# Patient Record
Sex: Male | Born: 1935 | Hispanic: Yes | Marital: Married | Smoking: Never smoker
Health system: Southern US, Community
[De-identification: ages and names within clinical notes are randomized; demographics above are authoritative.]

## PROBLEM LIST (undated history)

## (undated) DIAGNOSIS — E785 Hyperlipidemia, unspecified: Secondary | ICD-10-CM

## (undated) DIAGNOSIS — E039 Hypothyroidism, unspecified: Secondary | ICD-10-CM

## (undated) DIAGNOSIS — I10 Essential (primary) hypertension: Secondary | ICD-10-CM

## (undated) DIAGNOSIS — N400 Enlarged prostate without lower urinary tract symptoms: Secondary | ICD-10-CM

## (undated) HISTORY — PX: HERNIA REPAIR: SHX51

## (undated) HISTORY — PX: CHOLECYSTECTOMY: SHX55

---

## 2021-08-09 ENCOUNTER — Emergency Department: Payer: Medicare (Managed Care)

## 2021-08-09 ENCOUNTER — Emergency Department
Admission: EM | Admit: 2021-08-09 | Discharge: 2021-08-09 | Disposition: A | Discharge status: Home / Self Care | Payer: Medicare (Managed Care) | Attending: Emergency Medicine | Admitting: Emergency Medicine

## 2021-08-09 ENCOUNTER — Other Ambulatory Visit: Payer: Self-pay

## 2021-08-09 ENCOUNTER — Encounter: Payer: Self-pay | Admitting: Emergency Medicine

## 2021-08-09 DIAGNOSIS — I10 Essential (primary) hypertension: Secondary | ICD-10-CM | POA: Insufficient documentation

## 2021-08-09 DIAGNOSIS — R1030 Lower abdominal pain, unspecified: Secondary | ICD-10-CM | POA: Diagnosis present

## 2021-08-09 DIAGNOSIS — R339 Retention of urine, unspecified: Secondary | ICD-10-CM | POA: Diagnosis not present

## 2021-08-09 DIAGNOSIS — K5900 Constipation, unspecified: Secondary | ICD-10-CM

## 2021-08-09 HISTORY — DX: Essential (primary) hypertension: I10

## 2021-08-09 LAB — URINALYSIS, ROUTINE W REFLEX MICROSCOPIC
Bilirubin Urine: NEGATIVE
Glucose, UA: NEGATIVE mg/dL
Ketones, ur: NEGATIVE mg/dL
Nitrite: NEGATIVE
Protein, ur: 30 mg/dL — AB
RBC / HPF: >50 RBC/hpf — ABNORMAL HIGH (ref 0–5)
Specific Gravity, Urine: 1.01 (ref 1.005–1.030)
Squamous Epithelial / HPF: NONE SEEN (ref 0–5)
pH: 7 (ref 5.0–8.0)

## 2021-08-09 LAB — CBC WITH DIFFERENTIAL/PLATELET
Abs Immature Granulocytes: 0.03 10*3/uL (ref 0.00–0.07)
Basophils Absolute: 0.1 10*3/uL (ref 0.0–0.1)
Basophils Relative: 1 %
Eosinophils Absolute: 0.1 10*3/uL (ref 0.0–0.5)
Eosinophils Relative: 2 %
HCT: 42.8 % (ref 39.0–52.0)
Hemoglobin: 14.2 g/dL (ref 13.0–17.0)
Immature Granulocytes: 0 %
Lymphocytes Relative: 11 %
Lymphs Abs: 0.8 10*3/uL (ref 0.7–4.0)
MCH: 30.1 pg (ref 26.0–34.0)
MCHC: 33.2 g/dL (ref 30.0–36.0)
MCV: 90.7 fL (ref 80.0–100.0)
Monocytes Absolute: 0.8 10*3/uL (ref 0.1–1.0)
Monocytes Relative: 11 %
Neutro Abs: 5.3 10*3/uL (ref 1.7–7.7)
Neutrophils Relative %: 75 %
Platelets: 152 10*3/uL (ref 150–400)
RBC: 4.72 MIL/uL (ref 4.22–5.81)
RDW: 13 % (ref 11.5–15.5)
WBC: 7.1 10*3/uL (ref 4.0–10.5)
nRBC: 0 % (ref 0.0–0.2)

## 2021-08-09 LAB — BASIC METABOLIC PANEL
Anion gap: 6 (ref 5–15)
BUN: 14 mg/dL (ref 8–23)
CO2: 28 mmol/L (ref 22–32)
Calcium: 8.7 mg/dL — ABNORMAL LOW (ref 8.9–10.3)
Chloride: 107 mmol/L (ref 98–111)
Creatinine, Ser: 0.87 mg/dL (ref 0.61–1.24)
GFR, Estimated: >60 mL/min (ref >60)
Glucose, Bld: 129 mg/dL — ABNORMAL HIGH (ref 70–99)
Potassium: 3.5 mmol/L (ref 3.5–5.1)
Sodium: 141 mmol/L (ref 135–145)

## 2021-08-09 MED ORDER — LACTULOSE 10 GM/15ML PO SOLN: 20.0000 g | Freq: Every day | ORAL | 0 refills | Status: AC | PRN

## 2021-08-09 MED ORDER — CEPHALEXIN 500 MG PO CAPS: 500.0000 mg | ORAL_CAPSULE | Freq: Two times a day (BID) | ORAL | 0 refills | Status: DC

## 2021-08-09 NOTE — ED Notes (Signed)
Patient transported to CT 

## 2021-08-09 NOTE — ED Notes (Signed)
Pt switched to leg bag for discharge, pt and family given instruction on catheter care and how to switch to lag bag and standard drainage bag. Pt and family state understanding. Pt given standard drainage bad and extra leg bag. Pt given dc instructions to follow up with urology.

## 2021-08-09 NOTE — ED Provider Notes (Signed)
Patient received in signout from Dr. Beather Arbour pending follow-up labs and urinalysis.  Urinalysis does have hematuria with rare bacteria given acute retention and imaging showing probable cystitis will cover with antibiotics.  Blood work otherwise reassuring.  I do believe patient is appropriate for discharge and outpatient follow-up.   Merlyn Lot, MD 08/09/21 [phone]

## 2021-08-09 NOTE — ED Notes (Signed)
X-ray at bedside

## 2021-08-09 NOTE — Discharge Instructions (Addendum)
Keep bladder catheter in place until seen by the urologist.  I have written an antibiotic to to treat a possible bladder infection. You may take Lactulose as needed for bowel movements.  Return to the ER for worsening symptoms, persistent vomiting, difficulty breathing or other concerns.

## 2021-08-09 NOTE — ED Notes (Signed)
Bladder scan:730

## 2021-08-09 NOTE — ED Provider Notes (Signed)
Alec Estes    Event Date/Time   First MD Initiated Contact with Patient 08/09/21 [phone]     (approximate)   History   Urinary Retention   HPI  History obtained via patient and his daughter who interprets for him  Alec Estes is a 88 y.o. male brought to the ED from home by his daughter with a chief complaint of urinating difficulties since last night.  Voiding small amounts at a time.  Complains of suprapubic abdominal pain.  Last bowel movement 3 days ago; feels constipated.  Denies fever, chills, cough, chest pain, shortness of breath, nausea, vomiting or diarrhea.     Past Medical History   Past Medical History:  Diagnosis Date   Hypertension      Active Problem List  There are no problems to display for this patient.    Past Surgical History   Past Surgical History:  Procedure Laterality Date   CHOLECYSTECTOMY     HERNIA REPAIR       Home Medications   Prior to Admission medications   Medication Sig Start Date End Date Taking? Authorizing Provider  lactulose (CHRONULAC) 10 GM/15ML solution Take 30 mLs (20 g total) by mouth daily as needed for mild constipation. 08/09/21  Yes Paulette Blanch, MD     Allergies  Patient has no known allergies.   Family History  No family history on file.   Physical Exam  Triage Vital Signs: ED Triage Vitals  Enc Vitals Group     BP 08/09/21 0445 (!) 148/83     Pulse Rate 08/09/21 0445 (!) 113     Resp 08/09/21 0445 18     Temp 08/09/21 0445 98.4 F (36.9 C)     Temp src --      SpO2 08/09/21 0445 92 %     Weight 08/09/21 0446 136 lb 11 oz (62 kg)     Height --      Head Circumference --      Peak Flow --      Pain Score 08/09/21 0446 10     Pain Loc --      Pain Edu? --      Excl. in [place]? --     Updated Vital Signs: BP 135/73    Pulse 86    Temp 98.4 F (36.9 C)    Resp 16    Wt 62 kg    SpO2 92%    General: Awake, mild distress.  CV:  Tachycardic.   Good peripheral perfusion.  Resp:  Normal effort.  Abd:  Mild suprapubic abdominal pain with firm bladder.  No distention.  Other:  No vesicles.   ED Results / Procedures / Treatments  Labs (all labs ordered are listed, but only abnormal results are displayed) Labs Reviewed  URINE CULTURE  CBC WITH DIFFERENTIAL/PLATELET  BASIC METABOLIC PANEL  URINALYSIS, ROUTINE W REFLEX MICROSCOPIC     EKG  None   RADIOLOGY I have independently visualized and interpreted patient's three-way abdomen as well as reviewed the radiology interpretation:  3 way abdomen: Bilateral nephrolithiasis, no free air, no acute cardiopulmonary disease; moderate stool burden  CT renal stone study: Bilateral nephrolithiasis, no obstructive uropathy, gallstone, prostamegaly, moderate retained stool  Official radiology report(s): DG Abd Acute W/Chest  Result Date: 08/09/2021 CLINICAL DATA:  88 year old male with difficulty voiding. EXAM: DG ABDOMEN ACUTE WITH 1 VIEW CHEST COMPARISON:  None. FINDINGS: Portable AP upright view of the chest at 0527  hours. Lung volumes and mediastinal contours within normal limits. Visualized tracheal air column is within normal limits. Allowing for portable technique the lungs are clear. No pneumothorax or pneumoperitoneum. Upright and supine views of the abdomen and pelvis. No pneumoperitoneum. Calcified aortic atherosclerosis. Evidence of bilateral nephrolithiasis, with bulky 17 mm calcification projecting over the left renal shadow. Smaller oval up to 7 mm calcifications on the right side. Phleboliths and other vascular calcifications in the pelvis. Non obstructed bowel gas pattern. Moderate levoconvex lumbar scoliosis with advanced lumbar disc and endplate degeneration. No acute osseous abnormality identified. IMPRESSION: 1. Bilateral nephrolithiasis, including bulky 17 mm left side calculus. Small pelvic calcifications favored to be phleboliths rather than distal ureteral stones. 2.  Normal bowel gas pattern, no free air. 3. No acute cardiopulmonary abnormality. 4.  Aortic Atherosclerosis (ICD10-I70.0). Electronically Signed   By: Genevie Ann M.D.   On: 08/09/2021 05:35   CT Renal Stone Study  Result Date: 08/09/2021 CLINICAL DATA:  Evaluate kidney stones.  Difficulty urinating. EXAM: CT ABDOMEN AND PELVIS WITHOUT CONTRAST TECHNIQUE: Multidetector CT imaging of the abdomen and pelvis was performed following the standard protocol without IV contrast. RADIATION DOSE REDUCTION: This exam was performed according to the departmental dose-optimization program which includes automated exposure control, adjustment of the mA and/or kV according to patient size and/or use of iterative reconstruction technique. COMPARISON:  None FINDINGS: Lower chest: Scar identified within the left base. Hepatobiliary: No focal liver abnormality. Calcified gallstone measures 6 mm. No signs of gallbladder wall thickening or biliary dilatation. Pancreas: Unremarkable. No pancreatic ductal dilatation or surrounding inflammatory changes. Spleen: Normal in size without focal abnormality. Adrenals/Urinary Tract: Normal adrenal glands. Bilateral nephrolithiasis. Within the upper pole of the left kidney there is a large stone measuring up to 1.5 cm. There is also a small inferior pole stone measuring 1-2 mm. Multiple right renal calculi are noted. The largest stone is in the interpolar collecting system measuring 4 mm. No hydronephrosis identified bilaterally. No hydroureter or ureteral lithiasis. The bladder is partially decompressed around a Foley catheter. There is mild soft tissue stranding surrounding the decompressed bladder. Stomach/Bowel: Stomach appears normal. The appendix is visualized and is unremarkable. Moderate retained stool noted throughout the colon. Vascular/Lymphatic: No bowel wall thickening, inflammation, or distension. Reproductive: Prostate gland measures 5.1 x 5.9 by 6.6 cm (volume = 100 cm^3), image  79/2. Calcifications are scattered throughout the prostate gland. Other: No free fluid or fluid collections identified. Fat containing umbilical hernia. Signs of previous left inguinal hernia repair identified. Musculoskeletal: Multilevel degenerative disc disease is identified throughout the lumbar spine. IMPRESSION: 1. Bilateral nephrolithiasis. There is no hydronephrosis, hydroureter or ureteral lithiasis at this time. 2. Urinary bladder is decompressed around a Foley catheter balloon. Soft tissue stranding is identified surrounding the decompressed bladder which may reflect sequelae of cystitis or chronic bladder outlet obstruction. 3. Prostatomegaly. 4. Gallstone. 5. Moderate retained stool noted throughout the colon. Correlate for any clinical symptoms of constipation. Electronically Signed   By: Kerby Moors M.D.   On: 08/09/2021 06:37     PROCEDURES:  Critical Care performed: No  Procedures   MEDICATIONS ORDERED IN ED: Medications - No data to display   IMPRESSION / MDM / [place] / ED COURSE  I reviewed the triage vital signs and the nursing notes.                             88 year old male presenting with  urinary difficulty. Differential diagnosis includes, but is not limited to, acute appendicitis, renal colic, testicular torsion, urinary tract infection/pyelonephritis, prostatitis,  epididymitis, diverticulitis, small bowel obstruction or ileus, colitis, abdominal aortic aneurysm, gastroenteritis, hernia, etc. I have reviewed patient's old records and do not see any in the electronic healthcare system.  The patient is on the cardiac monitor to evaluate for evidence of arrhythmia and/or significant heart rate changes.  We will obtain basic lab work, UA, bladder scan, three-way abdomen.  Will reassess.  Clinical Course as of 08/09/21 0652  Thu Aug 09, 2021  [id number] Bladder scan revealed 730 mL; Foley inserted with immediate relief of symptoms.  Three-way abdomen  demonstrates bilateral nephrolithiasis and moderate stool burden; will obtain CT renal stone study to further characterize nephrolithiasis. [JS]  [phone] CT scan reveals no obstructive uropathy, gallstone, moderate stool burden. [JS]  [id number] CBC demonstrates normal WBC.  Awaiting results of BMP and UA.  Care will be transferred to the oncoming provider at change of shift.  Anticipate patient may be discharged home with Foley catheter, lactulose prescription and urology follow-up. [JS]    Clinical Course User Index [JS] Paulette Blanch, MD     FINAL CLINICAL IMPRESSION(S) / ED DIAGNOSES   Final diagnoses:  Urinary retention  Constipation, unspecified constipation type     Rx / DC Orders   ED Discharge Orders          Ordered    lactulose ([place]) 10 GM/15ML solution  Daily PRN        08/09/21 [id number]             Estes:  This document was prepared using Dragon voice recognition software and may include unintentional dictation errors.   Paulette Blanch, MD 08/09/21 [phone]

## 2021-08-09 NOTE — ED Triage Notes (Signed)
Pt to triage via w/c accomp by daughter, per stratus video interpreter, pt reports difficulty urinating since last night (last void approx day ago); denies hx of same

## 2021-08-10 LAB — URINE CULTURE: Culture: NO GROWTH

## 2021-08-11 ENCOUNTER — Emergency Department
Admission: EM | Admit: 2021-08-11 | Discharge: 2021-08-11 | Disposition: A | Discharge status: Home / Self Care | Payer: Medicare (Managed Care) | Attending: Emergency Medicine | Admitting: Emergency Medicine

## 2021-08-11 ENCOUNTER — Encounter: Payer: Self-pay | Admitting: Emergency Medicine

## 2021-08-11 ENCOUNTER — Other Ambulatory Visit: Payer: Self-pay

## 2021-08-11 DIAGNOSIS — Z7689 Persons encountering health services in other specified circumstances: Secondary | ICD-10-CM

## 2021-08-11 DIAGNOSIS — Z436 Encounter for attention to other artificial openings of urinary tract: Secondary | ICD-10-CM | POA: Insufficient documentation

## 2021-08-11 NOTE — ED Notes (Signed)
Pt discharge information reviewed. Pt understands need for follow up care and when to return if symptoms worsen. All questions answered. Pt is alert and oriented with even and regular respirations. Pt is brought out of department in wheelchiar by family.

## 2021-08-11 NOTE — ED Notes (Signed)
Pt to ED with daughter for foley cath removal. See triage note. Pt has urologist appt on 3/2 and had foley placed for urinary retention less than 1 week ago. Daughter was under impression that was supposed to have foley removed but then showed this RN picture of d/c papers from last visit which specify to leave foley in place until sees urologist.  Encouraged to use lactulose for constipation and to eat more vegetables.  Pt in NAD. Daughter would like to know if foley needs to be changed out before urologist appt.

## 2021-08-11 NOTE — ED Provider Notes (Signed)
[facility] Provider Note  Patient Contact: 6:22 PM (approximate)   History   No chief complaint on file.   HPI  Alec Estes is a 88 y.o. male who presents to the emergency department for assessment of his Foley catheter.  Patient had urinary retention, had Foley cath placed.  The patient and family were under the assumption that after 2 to 3 days he should return to have it removed.  Patient is to leave his Foley catheter in place until he sees urology on 2 March.  He has no abdominal pain, fevers, chills, dysuria.  He still has urine output through his Foley cath.     Physical Exam   Triage Vital Signs: ED Triage Vitals  Enc Vitals Group     BP 08/11/21 1632 118/60     Pulse Rate 08/11/21 1632 80     Resp 08/11/21 1632 18     Temp 08/11/21 1632 98.1 F (36.7 C)     Temp Source 08/11/21 1632 Oral     SpO2 08/11/21 1632 95 %     Weight --      Height --      Head Circumference --      Peak Flow --      Pain Score 08/11/21 1633 0     Pain Loc --      Pain Edu? --      Excl. in [place]? --     Most recent vital signs: Vitals:   08/11/21 1632  BP: 118/60  Pulse: 80  Resp: 18  Temp: 98.1 F (36.7 C)  SpO2: 95%     General: Alert and in no acute distress.  Cardiovascular:  Good peripheral perfusion Respiratory: Normal respiratory effort without tachypnea or retractions. Lungs CTAB.  Gastrointestinal: Bowel sounds 4 quadrants. Soft and nontender to palpation. No guarding or rigidity. No palpable masses. No distention. No CVA tenderness.  Urine is noted in the Foley catheter bag Musculoskeletal: Full range of motion to all extremities.  Neurologic:  No gross focal neurologic deficits are appreciated.  Skin:   No rash noted Other:   ED Results / Procedures / Treatments   Labs (all labs ordered are listed, but only abnormal results are displayed) Labs Reviewed - No data to display   EKG     RADIOLOGY    No results  found.  PROCEDURES:  Critical Care performed: No  Procedures   MEDICATIONS ORDERED IN ED: Medications - No data to display   IMPRESSION / MDM / ASSESSMENT AND PLAN / ED COURSE  I reviewed the triage vital signs and the nursing notes.                              Differential diagnosis includes, but is not limited to, accidental removal of Foley cath, Foley cath problems, urinary retention   Patient's diagnosis is consistent with encounter for assessment of Foley cath.  Patient presented with his daughter for possible removal of Foley cath.  They were under the assumption that he would only need to keep his Foley cath for 2 to 3 days.  Patient has good urinary output.  No complaints.  Patient is supposed to leave his Foley cath in until he sees urology on 2 March.  After patient and his daughter were informed of this they have no additional complaints.  There is no indication for labs or imaging.  The follow-up with  urology on their scheduled appointment on 08/23/2021.Alec Estes  Patient is given ED precautions to return to the ED for any worsening or new symptoms.        FINAL CLINICAL IMPRESSION(S) / ED DIAGNOSES   Final diagnoses:  Encounter for assessment of Foley catheter     Rx / DC Orders   ED Discharge Orders     None        Note:  This document was prepared using Dragon voice recognition software and may include unintentional dictation errors.   Alec Estes 08/11/21 1857    Alec Hacking, MD 08/12/21 1530

## 2021-08-11 NOTE — ED Triage Notes (Signed)
Pt via POV from home. Pt states they are here to get the cath removed. Pt was seen here on 2/16 and Was instructed to get the foley removed 3-4 days are d/c per family. Family states they have a urology appt on 3/2. Per family, pt has not had a BM since he was here. Last BM 2/16. Pt was prescribed Lactulose and family has not tried it because they thought It was for him not to have a BM.   Spanish interpreter needed.

## 2021-08-16 ENCOUNTER — Inpatient Hospital Stay
Admission: EM | Admit: 2021-08-16 | Discharge: 2021-08-19 | DRG: 698 | Disposition: A | Discharge status: Home / Self Care | Payer: Medicare (Managed Care) | Attending: Internal Medicine | Admitting: Internal Medicine

## 2021-08-16 ENCOUNTER — Emergency Department: Payer: Medicare (Managed Care)

## 2021-08-16 ENCOUNTER — Inpatient Hospital Stay: Payer: Medicare (Managed Care)

## 2021-08-16 ENCOUNTER — Encounter: Payer: Self-pay | Admitting: *Deleted

## 2021-08-16 DIAGNOSIS — T83511A Infection and inflammatory reaction due to indwelling urethral catheter, initial encounter: Secondary | ICD-10-CM | POA: Diagnosis present

## 2021-08-16 DIAGNOSIS — N3000 Acute cystitis without hematuria: Secondary | ICD-10-CM | POA: Diagnosis present

## 2021-08-16 DIAGNOSIS — Z8249 Family history of ischemic heart disease and other diseases of the circulatory system: Secondary | ICD-10-CM

## 2021-08-16 DIAGNOSIS — R519 Headache, unspecified: Secondary | ICD-10-CM | POA: Diagnosis present

## 2021-08-16 DIAGNOSIS — R338 Other retention of urine: Secondary | ICD-10-CM | POA: Diagnosis present

## 2021-08-16 DIAGNOSIS — N400 Enlarged prostate without lower urinary tract symptoms: Secondary | ICD-10-CM | POA: Diagnosis present

## 2021-08-16 DIAGNOSIS — H9190 Unspecified hearing loss, unspecified ear: Secondary | ICD-10-CM | POA: Diagnosis present

## 2021-08-16 DIAGNOSIS — E039 Hypothyroidism, unspecified: Secondary | ICD-10-CM | POA: Diagnosis present

## 2021-08-16 DIAGNOSIS — N390 Urinary tract infection, site not specified: Secondary | ICD-10-CM | POA: Diagnosis present

## 2021-08-16 DIAGNOSIS — N10 Acute tubulo-interstitial nephritis: Secondary | ICD-10-CM | POA: Diagnosis present

## 2021-08-16 DIAGNOSIS — E785 Hyperlipidemia, unspecified: Secondary | ICD-10-CM | POA: Diagnosis present

## 2021-08-16 DIAGNOSIS — N401 Enlarged prostate with lower urinary tract symptoms: Secondary | ICD-10-CM | POA: Diagnosis present

## 2021-08-16 DIAGNOSIS — Z20822 Contact with and (suspected) exposure to covid-19: Secondary | ICD-10-CM | POA: Diagnosis present

## 2021-08-16 DIAGNOSIS — N200 Calculus of kidney: Secondary | ICD-10-CM | POA: Diagnosis present

## 2021-08-16 DIAGNOSIS — A419 Sepsis, unspecified organism: Secondary | ICD-10-CM

## 2021-08-16 DIAGNOSIS — Y846 Urinary catheterization as the cause of abnormal reaction of the patient, or of later complication, without mention of misadventure at the time of the procedure: Secondary | ICD-10-CM | POA: Diagnosis present

## 2021-08-16 DIAGNOSIS — I10 Essential (primary) hypertension: Secondary | ICD-10-CM | POA: Diagnosis present

## 2021-08-16 DIAGNOSIS — A4152 Sepsis due to Pseudomonas: Secondary | ICD-10-CM | POA: Diagnosis present

## 2021-08-16 HISTORY — DX: Benign prostatic hyperplasia without lower urinary tract symptoms: N40.0

## 2021-08-16 HISTORY — DX: Hyperlipidemia, unspecified: E78.5

## 2021-08-16 HISTORY — DX: Hypothyroidism, unspecified: E03.9

## 2021-08-16 LAB — CBC WITH DIFFERENTIAL/PLATELET
Abs Immature Granulocytes: 0.23 10*3/uL — ABNORMAL HIGH (ref 0.00–0.07)
Basophils Absolute: 0.1 10*3/uL (ref 0.0–0.1)
Basophils Relative: 0 %
Eosinophils Absolute: 0 10*3/uL (ref 0.0–0.5)
Eosinophils Relative: 0 %
HCT: 41.8 % (ref 39.0–52.0)
Hemoglobin: 13.9 g/dL (ref 13.0–17.0)
Immature Granulocytes: 1 %
Lymphocytes Relative: 5 %
Lymphs Abs: 1.2 10*3/uL (ref 0.7–4.0)
MCH: 29.6 pg (ref 26.0–34.0)
MCHC: 33.3 g/dL (ref 30.0–36.0)
MCV: 89.1 fL (ref 80.0–100.0)
Monocytes Absolute: 2 10*3/uL — ABNORMAL HIGH (ref 0.1–1.0)
Monocytes Relative: 9 %
Neutro Abs: 19.1 10*3/uL — ABNORMAL HIGH (ref 1.7–7.7)
Neutrophils Relative %: 85 %
Platelets: 191 10*3/uL (ref 150–400)
RBC: 4.69 MIL/uL (ref 4.22–5.81)
RDW: 13.3 % (ref 11.5–15.5)
WBC: 22.6 10*3/uL — ABNORMAL HIGH (ref 4.0–10.5)
nRBC: 0 % (ref 0.0–0.2)

## 2021-08-16 LAB — COMPREHENSIVE METABOLIC PANEL
ALT: 20 U/L (ref 0–44)
AST: 21 U/L (ref 15–41)
Albumin: 3.6 g/dL (ref 3.5–5.0)
Alkaline Phosphatase: 90 U/L (ref 38–126)
Anion gap: 8 (ref 5–15)
BUN: 20 mg/dL (ref 8–23)
CO2: 24 mmol/L (ref 22–32)
Calcium: 8.4 mg/dL — ABNORMAL LOW (ref 8.9–10.3)
Chloride: 101 mmol/L (ref 98–111)
Creatinine, Ser: 0.98 mg/dL (ref 0.61–1.24)
GFR, Estimated: >60 mL/min (ref >60)
Glucose, Bld: 132 mg/dL — ABNORMAL HIGH (ref 70–99)
Potassium: 3.8 mmol/L (ref 3.5–5.1)
Sodium: 133 mmol/L — ABNORMAL LOW (ref 135–145)
Total Bilirubin: 1.6 mg/dL — ABNORMAL HIGH (ref 0.3–1.2)
Total Protein: 7.4 g/dL (ref 6.5–8.1)

## 2021-08-16 LAB — URINALYSIS, ROUTINE W REFLEX MICROSCOPIC
Bilirubin Urine: NEGATIVE
Glucose, UA: NEGATIVE mg/dL
Ketones, ur: 5 mg/dL — AB
Nitrite: NEGATIVE
Protein, ur: 100 mg/dL — AB
Specific Gravity, Urine: 1.017 (ref 1.005–1.030)
WBC, UA: >50 WBC/hpf — ABNORMAL HIGH (ref 0–5)
pH: 7 (ref 5.0–8.0)

## 2021-08-16 LAB — LACTIC ACID, PLASMA: Lactic Acid, Venous: 1.4 mmol/L (ref 0.5–1.9)

## 2021-08-16 LAB — PROTIME-INR
INR: 1.2 (ref 0.8–1.2)
Prothrombin Time: 14.7 seconds (ref 11.4–15.2)

## 2021-08-16 MED ORDER — PIPERACILLIN-TAZOBACTAM 3.375 G IVPB
3.3750 g | Freq: Three times a day (TID) | INTRAVENOUS | Status: DC
  Administered 2021-08-17 – 2021-08-18 (×4): 3.375 g via INTRAVENOUS
  Filled 2021-08-16 (×4): qty 50

## 2021-08-16 MED ORDER — ACETAMINOPHEN 650 MG RE SUPP: 650.0000 mg | Freq: Four times a day (QID) | RECTAL | Status: DC | PRN

## 2021-08-16 MED ORDER — LACTATED RINGERS IV SOLN: INTRAVENOUS | Status: AC

## 2021-08-16 MED ORDER — PIPERACILLIN-TAZOBACTAM 3.375 G IVPB 30 MIN
3.3750 g | Freq: Once | INTRAVENOUS | Status: AC
  Administered 2021-08-16: 3.375 g via INTRAVENOUS
  Filled 2021-08-16: qty 50

## 2021-08-16 MED ORDER — ACETAMINOPHEN 325 MG PO TABS
650.0000 mg | ORAL_TABLET | Freq: Four times a day (QID) | ORAL | Status: DC | PRN
  Administered 2021-08-17: 650 mg via ORAL
  Filled 2021-08-16: qty 2

## 2021-08-16 NOTE — Progress Notes (Signed)
Pharmacy Antibiotic Note  Malachai Ingersoll is a 88 y.o. male admitted on 08/16/2021 with sepsis.  Pharmacy has been consulted for Zosyn dosing.  Plan: Zosyn 3.375g IV q8h (4 hour infusion).  Pharmacy will continue to follow and adjust abx dosing whenever warranted.  Height: 5\' 5"  (165.1 cm) Weight: 62 kg (136 lb 11 oz) IBW/kg (Calculated) : 61.5  Temp (24hrs), Avg:103 F (39.4 C), Min:103 F (39.4 C), Max:103 F (39.4 C)  Recent Labs  Lab 08/16/21 2158 08/16/21 2202  WBC 22.6*  --   CREATININE 0.98  --   LATICACIDVEN  --  1.4    Estimated Creatinine Clearance: 44.5 mL/min (by C-G formula based on SCr of 0.98 mg/dL).    No Known Allergies  Antimicrobials this admission: 2/23 Zosyn >>   Microbiology results: 2/23 BCx: Pending 2/23 UCx: Pending   Thank you for allowing pharmacy to be a part of this patients care.  Renda Rolls, PharmD, [facility] 08/16/2021 11:50 PM

## 2021-08-16 NOTE — Progress Notes (Signed)
°  Carryover admission to the Day Admitter.  I discussed this case with the EDP, Dr.Quale.  Per these discussions:   This is a 88 year old male who is being admitted for sepsis due to suspected urinary tract infection.  He had presented to the ED approximately 1 week ago, at which time he was diagnosed with acute urinary retention.  Foley catheter was placed in the ED, the patient was discharged to home on Keflex for potential urinary tract infection, the course of which he subsequently completed.  Ensuing urine culture reportedly negative.  Today, the patient presents back to the emergency department complaining of new onset fevers.  Today's urinalysis, with Foley catheter in place, demonstrating greater than 50 white blood cells.  He is also mildly tachycardic, mildly tachypneic.  CT abdomen/pelvis showed evidence of bladder wall thickening consistent with acute cystitis, but otherwise reportedly showed no evidence of acute process.  Blood cultures x2 urine culture collected followed by initiation of Zosyn for sepsis due to suspected urinary tract infection after failure of outpatient Keflex.  Of note, COVID-19/influenza PCR currently pending.  Lactic acid 1.4.  I have placed order to admit patient to med telemetry for further evaluation and management of sepsis due to suspected urinary tract infection. I have placed some preliminary admit orders via the adult multi admission order set.  I continued Zosyn.  Of also ordered a chest x-ray as well as procalcitonin, and started the patient on gentle IV fluids.  I have also ordered morning labs.   Of note, the patient is reportedly Spanish-speaking predominant, and hard of hearing.   Newton Pigg, DO Hospitalist

## 2021-08-16 NOTE — ED Triage Notes (Signed)
Pt brought in via ems from home.  Pt with weakness for 4 days.  Pt has a uti and has a foley in place since last week.  Pt alert  interpreter on a stick used in triage.

## 2021-08-16 NOTE — Progress Notes (Signed)
Sepsis tracking by eLINK 

## 2021-08-16 NOTE — ED Provider Notes (Signed)
[facility] Provider Note    Event Date/Time   First MD Initiated Contact with Patient 08/16/21 2331     (approximate)   History   Weakness  Video interpreter services used, but somewhat difficult as the patient very hard of hearing.  However his family at the bedside also helps and assist with obtaining history, discussing with the patient and assisting in getting clinical history.  Video interpreter utilized  HPI  Alec Estes is a 88 y.o. male with a history of recent urinary retention, Foley catheter insertion.  About a week ago he had difficulty urinating he had to have a catheter inserted and developed a possible urinary tract infection.  He is completed antibiotic for that twice daily for the last several days.  Today started feeling very fatigued weak to the point he could not stand up, lightheaded with standing and "weak in the knees"  He had body aches and fever and mild headache  No neck pain no difficulty breathing no chest pain.  Foley catheters remain in place   Past Medical History:  Diagnosis Date   Hypertension         Physical Exam   Triage Vital Signs: ED Triage Vitals  Enc Vitals Group     BP 08/16/21 2143 130/63     Pulse Rate 08/16/21 2143 (!) 102     Resp 08/16/21 2143 (!) 24     Temp 08/16/21 2143 (!) 103 F (39.4 C)     Temp Source 08/16/21 2143 Oral     SpO2 08/16/21 2143 90 %     Weight 08/16/21 2144 136 lb 11 oz (62 kg)     Height 08/16/21 2308 5\' 5"  (1.651 m)     Head Circumference --      Peak Flow --      Pain Score 08/16/21 2142 0     Pain Loc --      Pain Edu? --      Excl. in GC? --     Most recent vital signs: Vitals:   08/16/21 2143 08/16/21 2225  BP: 130/63 138/72  Pulse: 100 95  Resp: 16 17  Temp: (!) 103 F (39.4 C)   SpO2: 90% 96%     General: Awake, no distress.  Appears mildly ill, fatigued but not in acute distress CV:  Good peripheral perfusion.  Tachycardia regular  normal heart sounds Resp:  Normal effort.  Clear lung fields bilaterally.  Speaks in clear sentences Abd:  No distention.  Denies pain to palpation in any quadrant or area.  The penis appears normal without surrounding erythema or obvious drainage or erosion Other:  Moves all extremities well reports pain and achiness primarily in his knee joints, but knee joints appear normal no erythema warmth or effusion  He reports mild headache, but no photophobia.  No meningismus.  Full range of motion of neck without pain or stiffness  ED Results / Procedures / Treatments   Labs (all labs ordered are listed, but only abnormal results are displayed) Labs Reviewed  COMPREHENSIVE METABOLIC PANEL - Abnormal; Notable for the following components:      Result Value   Sodium 133 (*)    Glucose, Bld 132 (*)    Calcium 8.4 (*)    Total Bilirubin 1.6 (*)    All other components within normal limits  CBC WITH DIFFERENTIAL/PLATELET - Abnormal; Notable for the following components:   WBC 22.6 (*)    Neutro Abs 19.1 (*)  Monocytes Absolute 2.0 (*)    Abs Immature Granulocytes 0.23 (*)    All other components within normal limits  URINALYSIS, ROUTINE W REFLEX MICROSCOPIC - Abnormal; Notable for the following components:   Color, Urine AMBER (*)    APPearance CLOUDY (*)    Hgb urine dipstick MODERATE (*)    Ketones, ur 5 (*)    Protein, ur 100 (*)    Leukocytes,Ua LARGE (*)    WBC, UA >50 (*)    Bacteria, UA RARE (*)    All other components within normal limits  CULTURE, BLOOD (ROUTINE X 2)  URINE CULTURE  RESP PANEL BY RT-PCR (FLU A&B, COVID) ARPGX2  CULTURE, BLOOD (ROUTINE X 2) W REFLEX TO ID PANEL  LACTIC ACID, PLASMA  PROTIME-INR  LACTIC ACID, PLASMA  MAGNESIUM  COMPREHENSIVE METABOLIC PANEL  CBC WITH DIFFERENTIAL/PLATELET  PROCALCITONIN   Labs reviewed remarkable for significant leukocytes white cells and bacteria in his urine.  Additionally severe leukocytosis white count of 22,000 and  mild elevation of creatinine.   EKG  Reviewed and interpreted by me at 2200 Heart rate 100 QRS 89 QTc 470 Sinus tachycardia, mild nonspecific T wave abnormality.  Mild prolongation of QT interval   RADIOLOGY  Chest x-ray reviewed, mild left greater than right bibasilar airspace disease possibly atelectasis.  Mild cardiomegaly  CT abdomen pelvis personally viewed by me and interpreted as grossly without acute hydronephrosis.  Defer to radiologist for additional detailed read as noted below IMPRESSION: 1. Multiple bilateral kidney stones without hydronephrosis or ureteral stone. Decompressed urinary bladder with Foley catheter present. Bladder is diffusely thick-walled with perivesical soft tissue stranding which may be due to cystitis or chronic obstruction 2. Markedly enlarged prostate gland 3. Small bilateral effusions 4. Diverticular disease of the distal colon without acute wall thickening Electronically Signed   By: Jasmine Pang M.D.   On: 08/16/2021 23:10       PROCEDURES:  Critical Care performed: Yes, see critical care procedure note(s) CRITICAL CARE Performed by: Sharyn Creamer   Total critical care time: 30 minutes  Critical care time was exclusive of separately billable procedures and treating other patients.  Critical care was necessary to treat or prevent imminent or life-threatening deterioration.  Critical care was time spent personally by me on the following activities: development of treatment plan with patient and/or surrogate as well as nursing, discussions with consultants, evaluation of patient's response to treatment, examination of patient, obtaining history from patient or surrogate, ordering and performing treatments and interventions, ordering and review of laboratory studies, ordering and review of radiographic studies, pulse oximetry and re-evaluation of patient's condition.  Procedures   MEDICATIONS ORDERED IN ED: Medications  acetaminophen (TYLENOL)  tablet 650 mg (has no administration in time range)    Or  acetaminophen (TYLENOL) suppository 650 mg (has no administration in time range)  lactated ringers infusion (has no administration in time range)  piperacillin-tazobactam (ZOSYN) IVPB 3.375 g (has no administration in time range)  piperacillin-tazobactam (ZOSYN) IVPB 3.375 g (3.375 g Intravenous New Bag/Given 08/16/21 2313)     IMPRESSION / MDM / ASSESSMENT AND PLAN / ED COURSE  I reviewed the triage vital signs and the nursing notes.                              Differential diagnosis includes, but is not limited to, catheter associated UTI, sepsis, toxic or metabolic illogic etiology of fever though favored to be  infectious based on the clinical presentation.  He does not have evidence to support  The patient is on the cardiac monitor to evaluate for evidence of arrhythmia and/or significant heart rate changes.  Labs are reviewed concerning for possible urinary tract infection catheter related upon review of the urinalysis, also notable leukocytosis concerning for infectious etiology  Reviewed prior urine culture which was no growth, he has been on Keflex and given his worsening symptoms and findings that I suspect are indicative of catheter associated urinary tract infection will start on broader coverage including Zosyn and antipseudomonal coverage, etc  Patient and family understand agreeable with plan for admission.  Admission discussed with Dr. Arlean Hopping   FINAL CLINICAL IMPRESSION(S) / ED DIAGNOSES   Final diagnoses:  Urinary tract infection associated with indwelling urethral catheter, initial encounter (HCC)  Sepsis, due to unspecified organism, unspecified whether acute organ dysfunction present [facility])  Catheter associated UTI Sepsis   Rx / DC Orders   ED Discharge Orders     None        Note:  This document was prepared using Dragon voice recognition software and may include unintentional dictation  errors.   Sharyn Creamer, MD 08/17/21 [phone]

## 2021-08-17 ENCOUNTER — Encounter: Payer: Self-pay | Admitting: Internal Medicine

## 2021-08-17 ENCOUNTER — Other Ambulatory Visit: Payer: Self-pay

## 2021-08-17 DIAGNOSIS — A419 Sepsis, unspecified organism: Secondary | ICD-10-CM

## 2021-08-17 DIAGNOSIS — N400 Enlarged prostate without lower urinary tract symptoms: Secondary | ICD-10-CM | POA: Diagnosis present

## 2021-08-17 DIAGNOSIS — E039 Hypothyroidism, unspecified: Secondary | ICD-10-CM | POA: Diagnosis present

## 2021-08-17 DIAGNOSIS — I10 Essential (primary) hypertension: Secondary | ICD-10-CM

## 2021-08-17 DIAGNOSIS — N3000 Acute cystitis without hematuria: Secondary | ICD-10-CM

## 2021-08-17 DIAGNOSIS — E785 Hyperlipidemia, unspecified: Secondary | ICD-10-CM | POA: Diagnosis present

## 2021-08-17 LAB — CBC WITH DIFFERENTIAL/PLATELET
Abs Immature Granulocytes: 0.44 10*3/uL — ABNORMAL HIGH (ref 0.00–0.07)
Basophils Absolute: 0.1 10*3/uL (ref 0.0–0.1)
Basophils Relative: 0 %
Eosinophils Absolute: 0 10*3/uL (ref 0.0–0.5)
Eosinophils Relative: 0 %
HCT: 38.8 % — ABNORMAL LOW (ref 39.0–52.0)
Hemoglobin: 13.1 g/dL (ref 13.0–17.0)
Immature Granulocytes: 2 %
Lymphocytes Relative: 6 %
Lymphs Abs: 1.4 10*3/uL (ref 0.7–4.0)
MCH: 30 pg (ref 26.0–34.0)
MCHC: 33.8 g/dL (ref 30.0–36.0)
MCV: 89 fL (ref 80.0–100.0)
Monocytes Absolute: 2.1 10*3/uL — ABNORMAL HIGH (ref 0.1–1.0)
Monocytes Relative: 9 %
Neutro Abs: 19.4 10*3/uL — ABNORMAL HIGH (ref 1.7–7.7)
Neutrophils Relative %: 83 %
Platelets: 168 10*3/uL (ref 150–400)
RBC: 4.36 MIL/uL (ref 4.22–5.81)
RDW: 13.3 % (ref 11.5–15.5)
WBC: 23.5 10*3/uL — ABNORMAL HIGH (ref 4.0–10.5)
nRBC: 0 % (ref 0.0–0.2)

## 2021-08-17 LAB — COMPREHENSIVE METABOLIC PANEL
ALT: 18 U/L (ref 0–44)
AST: 17 U/L (ref 15–41)
Albumin: 3.1 g/dL — ABNORMAL LOW (ref 3.5–5.0)
Alkaline Phosphatase: 74 U/L (ref 38–126)
Anion gap: 8 (ref 5–15)
BUN: 19 mg/dL (ref 8–23)
CO2: 24 mmol/L (ref 22–32)
Calcium: 8.1 mg/dL — ABNORMAL LOW (ref 8.9–10.3)
Chloride: 102 mmol/L (ref 98–111)
Creatinine, Ser: 1.04 mg/dL (ref 0.61–1.24)
GFR, Estimated: >60 mL/min (ref >60)
Glucose, Bld: 136 mg/dL — ABNORMAL HIGH (ref 70–99)
Potassium: 3.7 mmol/L (ref 3.5–5.1)
Sodium: 134 mmol/L — ABNORMAL LOW (ref 135–145)
Total Bilirubin: 1.5 mg/dL — ABNORMAL HIGH (ref 0.3–1.2)
Total Protein: 6.4 g/dL — ABNORMAL LOW (ref 6.5–8.1)

## 2021-08-17 LAB — RESP PANEL BY RT-PCR (FLU A&B, COVID) ARPGX2
Influenza A by PCR: NEGATIVE
Influenza B by PCR: NEGATIVE
SARS Coronavirus 2 by RT PCR: NEGATIVE

## 2021-08-17 LAB — LACTIC ACID, PLASMA: Lactic Acid, Venous: 1.3 mmol/L (ref 0.5–1.9)

## 2021-08-17 LAB — PROCALCITONIN: Procalcitonin: 0.21 ng/mL

## 2021-08-17 LAB — MAGNESIUM: Magnesium: 1.8 mg/dL (ref 1.7–2.4)

## 2021-08-17 MED ORDER — TAMSULOSIN HCL 0.4 MG PO CAPS
0.4000 mg | ORAL_CAPSULE | Freq: Every day | ORAL | Status: DC
  Administered 2021-08-17 – 2021-08-18 (×2): 0.4 mg via ORAL
  Filled 2021-08-17 (×2): qty 1

## 2021-08-17 MED ORDER — SODIUM CHLORIDE 0.9 % IV BOLUS
1000.0000 mL | Freq: Once | INTRAVENOUS | Status: AC
  Administered 2021-08-17: 1000 mL via INTRAVENOUS

## 2021-08-17 MED ORDER — ATORVASTATIN CALCIUM 20 MG PO TABS
20.0000 mg | ORAL_TABLET | Freq: Every day | ORAL | Status: DC
Start: 2021-08-17 — End: 2021-08-19
  Administered 2021-08-17 – 2021-08-18 (×2): 20 mg via ORAL
  Filled 2021-08-17 (×2): qty 1

## 2021-08-17 MED ORDER — LEVOTHYROXINE SODIUM 50 MCG PO TABS
75.0000 ug | ORAL_TABLET | Freq: Every day | ORAL | Status: DC
  Administered 2021-08-18 – 2021-08-19 (×2): 75 ug via ORAL
  Filled 2021-08-17 (×2): qty 2

## 2021-08-17 MED ORDER — TRAMADOL HCL 50 MG PO TABS: 50.0000 mg | ORAL_TABLET | Freq: Two times a day (BID) | ORAL | Status: DC | PRN

## 2021-08-17 MED ORDER — LACTULOSE 10 GM/15ML PO SOLN: 20.0000 g | Freq: Every day | ORAL | Status: DC | PRN

## 2021-08-17 MED ORDER — ENOXAPARIN SODIUM 40 MG/0.4ML IJ SOSY
40.0000 mg | PREFILLED_SYRINGE | Freq: Every day | INTRAMUSCULAR | Status: DC
  Administered 2021-08-18 – 2021-08-19 (×2): 40 mg via SUBCUTANEOUS
  Filled 2021-08-17 (×2): qty 0.4

## 2021-08-17 MED ORDER — CHLORHEXIDINE GLUCONATE CLOTH 2 % EX PADS
6.0000 | MEDICATED_PAD | Freq: Every day | CUTANEOUS | Status: DC
  Administered 2021-08-18 – 2021-08-19 (×2): 6 via TOPICAL

## 2021-08-17 MED ORDER — HYDRALAZINE HCL 20 MG/ML IJ SOLN: 5.0000 mg | INTRAMUSCULAR | Status: DC | PRN

## 2021-08-17 MED ORDER — VIBEGRON 75 MG PO TABS: 75.0000 mg | ORAL_TABLET | Freq: Every day | ORAL | Status: DC

## 2021-08-17 MED ORDER — ONDANSETRON HCL 4 MG/2ML IJ SOLN
4.0000 mg | Freq: Three times a day (TID) | INTRAMUSCULAR | Status: DC | PRN
Start: 2021-08-17 — End: 2021-08-19

## 2021-08-17 MED ORDER — FINASTERIDE 5 MG PO TABS
5.0000 mg | ORAL_TABLET | Freq: Every day | ORAL | Status: DC
  Administered 2021-08-17 – 2021-08-19 (×3): 5 mg via ORAL
  Filled 2021-08-17 (×3): qty 1

## 2021-08-17 MED ORDER — VITAMIN D 25 MCG (1000 UNIT) PO TABS
5000.0000 [IU] | ORAL_TABLET | Freq: Every day | ORAL | Status: DC
  Administered 2021-08-18 – 2021-08-19 (×2): 5000 [IU] via ORAL
  Filled 2021-08-17 (×3): qty 5

## 2021-08-17 NOTE — Plan of Care (Signed)
  Problem: Health Behavior/Discharge Planning: Goal: Ability to manage health-related needs will improve Outcome: Progressing   

## 2021-08-17 NOTE — H&P (Addendum)
History and Physical    Alec Estes [id number] DOB: 10/27/1929 DOA: 08/16/2021  Referring MD/NP/PA:   PCP: Pcp, No   Patient coming from:  The patient is coming from home.  At baseline, pt is independent for most of ADL.        Chief Complaint: Dysuria, difficulty urinating, bilateral flank pain  HPI: Alec Estes is a 88 y.o. male with medical history significant of hypertension, hypothyroidism, BPH, who presents with dysuria, difficulty urinating and bilateral flank pain.  Patient speaks Spanish, history is obtained with iPad interpreter's help.  His daughter is at the bedside.  Patient was seen in the ED on 2/16 due to acute urinary retention.  Foley catheter is replaced.  Patient was discharged on cephalexin for possible UTI.  Patient states that he has dysuria and burning on urination.  No hematuria.  Has nausea, no vomiting, diarrhea or abdominal.  He complains of bilateral flank pain.  Denies chest pain, cough, shortness of breath. He has fever and chills. His body temperature was 103 in ED.  Data Reviewed and ED Course: pt was found to have WBC 23.5, positive urinalysis (cloudy appearance, large amount of leukocyte, rare bacteria, WBC> 50), lactic acid 1.4, 1.3, INR 1.2.  Negative COVID PCR, GFR > 60.  Blood pressure 113/62, heart rate 102, RR 24, oxygen saturation 96% on room air.  CT for renal stone protocol showed multiple bilateral kidney stone but no hydronephrosis or ureteral stone. Patient is admitted to telemetry bed as inpatient.  CT-renal stone: 1. Multiple bilateral kidney stones without hydronephrosis or ureteral stone. Decompressed urinary bladder with Foley catheter present. Bladder is diffusely thick-walled with perivesical soft tissue stranding which may be due to cystitis or chronic obstruction 2. Markedly enlarged prostate gland 3. Small bilateral effusions 4. Diverticular disease of the distal colon without acute  wall thickening  CXR: 1. Minor left greater than right basilar airspace disease, likely atelectasis. 2. Mild cardiomegaly.  EKG: I have personally reviewed.  Sinus rhythm, low voltage, QTc 477, borderline left axis deviation   Review of Systems:   General: has fevers, chills, no body weight gain, has poor appetite, has fatigue HEENT: no blurry vision, hearing changes or sore throat Respiratory: no dyspnea, coughing, wheezing CV: no chest pain, no palpitations GI: has nausea, no vomiting, abdominal pain, diarrhea, constipation GU: has dysuria, burning on urination, increased urinary frequency, no hematuria  Ext: no leg edema Neuro: no unilateral weakness, numbness, or tingling, no vision change or hearing loss Skin: no rash, no skin tear. MSK: No muscle spasm, no deformity, no limitation of range of movement in spin Heme: No easy bruising.  Travel history: No recent long distant travel.   Allergy: No Known Allergies  Past Medical History:  Diagnosis Date   BPH (benign prostatic hyperplasia)    HLD (hyperlipidemia)    Hypertension    Hypothyroidism     Past Surgical History:  Procedure Laterality Date   CHOLECYSTECTOMY     HERNIA REPAIR      Social History:  reports that he has never smoked. He has never used smokeless tobacco. He reports that he does not drink alcohol and does not use drugs.  Family History:  Family History  Problem Relation Age of Onset   Heart attack Mother      Prior to Admission medications   Medication Sig Start Date End Date Taking? Authorizing Provider  lactulose (CHRONULAC) 10 GM/15ML solution Take 30 mLs (20 g total) by mouth daily as  needed for mild constipation. 08/09/21   Paulette Blanch, MD    Physical Exam: Vitals:   08/17/21 1300 08/17/21 1330 08/17/21 1400 08/17/21 1600  BP: (!) 114/54 (!) 106/43 (!) 121/56 (!) 125/47  Pulse: 66 65 65 68  Resp: 20 17 17 16   Temp:    98.3 F (36.8 C)  TempSrc:      SpO2: 95% 94% 95% 95%   Weight:      Height:       General: Not in acute distress HEENT:       Eyes: PERRL, EOMI, no scleral icterus.       ENT: No discharge from the ears and nose, no pharynx injection, no tonsillar enlargement.        Neck: No JVD, no bruit, no mass felt. Heme: No neck lymph node enlargement. Cardiac: S1/S2, RRR, No murmurs, No gallops or rubs. Respiratory: No rales, wheezing, rhonchi or rubs. GI: Soft, nondistended, nontender, no rebound pain, no organomegaly, BS present. GU: has CVA tenderness bilaterally Ext: No pitting leg edema bilaterally. 1+DP/PT pulse bilaterally. Musculoskeletal: No joint deformities, No joint redness or warmth, no limitation of ROM in spin. Skin: No rashes.  Neuro: Alert, oriented X3, cranial nerves II-XII grossly intact, moves all extremities normally.  Psych: Patient is not psychotic, no suicidal or hemocidal ideation.  Labs on Admission: I have personally reviewed following labs and imaging studies  CBC: Recent Labs  Lab 08/16/21 2158 08/17/21 0419  WBC 22.6* 23.5*  NEUTROABS 19.1* 19.4*  HGB 13.9 13.1  HCT 41.8 38.8*  MCV 89.1 89.0  PLT 191 [id number]   Basic Metabolic Panel: Recent Labs  Lab 08/16/21 2158 08/17/21 0419  NA 133* 134*  K 3.8 3.7  CL 101 102  CO2 24 24  GLUCOSE 132* 136*  BUN 20 19  CREATININE 0.98 1.04  CALCIUM 8.4* 8.1*  MG  --  1.8   GFR: Estimated Creatinine Clearance: 41.9 mL/min (by C-G formula based on SCr of 1.04 mg/dL). Liver Function Tests: Recent Labs  Lab 08/16/21 2158 08/17/21 0419  AST 21 17  ALT 20 18  ALKPHOS 90 74  BILITOT 1.6* 1.5*  PROT 7.4 6.4*  ALBUMIN 3.6 3.1*   No results for input(s): LIPASE, AMYLASE in the last 168 hours. No results for input(s): AMMONIA in the last 168 hours. Coagulation Profile: Recent Labs  Lab 08/16/21 2158  INR 1.2   Cardiac Enzymes: No results for input(s): CKTOTAL, CKMB, CKMBINDEX, TROPONINI in the last 168 hours. BNP (last 3 results) No results for input(s):  PROBNP in the last 8760 hours. HbA1C: No results for input(s): HGBA1C in the last 72 hours. CBG: No results for input(s): GLUCAP in the last 168 hours. Lipid Profile: No results for input(s): CHOL, HDL, LDLCALC, TRIG, CHOLHDL, LDLDIRECT in the last 72 hours. Thyroid Function Tests: No results for input(s): TSH, T4TOTAL, FREET4, T3FREE, THYROIDAB in the last 72 hours. Anemia Panel: No results for input(s): VITAMINB12, FOLATE, FERRITIN, TIBC, IRON, RETICCTPCT in the last 72 hours. Urine analysis:    Component Value Date/Time   COLORURINE AMBER (A) 08/16/2021 2202   APPEARANCEUR CLOUDY (A) 08/16/2021 2202   LABSPEC 1.017 08/16/2021 2202   PHURINE 7.0 08/16/2021 2202   GLUCOSEU NEGATIVE 08/16/2021 2202   HGBUR MODERATE (A) 08/16/2021 2202   BILIRUBINUR NEGATIVE 08/16/2021 2202   KETONESUR 5 (A) 08/16/2021 2202   PROTEINUR 100 (A) 08/16/2021 2202   NITRITE NEGATIVE 08/16/2021 2202   LEUKOCYTESUR LARGE (A) 08/16/2021 2202   Sepsis  Labs: @LABRCNTIP (procalcitonin:4,lacticidven:4) ) Recent Results (from the past 240 hour(s))  Urine Culture     Status: None   Collection Time: 08/09/21  6:23 AM   Specimen: Urine, Catheterized  Result Value Ref Range Status   Specimen Description   Final    URINE, CATHETERIZED Performed at [facility], [address] [place], [place] [address]    Special Requests   Final    NONE Performed at [facility], [address] [place], [place] [address]    Culture   Final    NO GROWTH Performed at [facility], [place] [address] [place], [place] [address]    Report Status 08/10/2021 FINAL  Final  Culture, blood (Routine x 2)     Status: None (Preliminary result)   Collection Time: 08/16/21 10:02 PM   Specimen: BLOOD  Result Value Ref Range Status   Specimen Description BLOOD RIGHT ASSIST CONTROL  Final   Special Requests   Final    BOTTLES DRAWN AEROBIC AND ANAEROBIC Blood Culture adequate volume   Culture   Final    NO  GROWTH < 12 HOURS Performed at [facility], [address] [place], [place] [address]    Report Status PENDING  Incomplete  Resp Panel by RT-PCR (Flu A&B, Covid) Nasopharyngeal Swab     Status: None   Collection Time: 08/17/21  1:22 AM   Specimen: Nasopharyngeal Swab; Nasopharyngeal(NP) swabs in vial transport medium  Result Value Ref Range Status   SARS Coronavirus 2 by RT PCR NEGATIVE NEGATIVE Final    Comment: (NOTE) SARS-CoV-2 target nucleic acids are NOT DETECTED.  The SARS-CoV-2 RNA is generally detectable in upper respiratory specimens during the acute phase of infection. The lowest concentration of SARS-CoV-2 viral copies this assay can detect is 138 copies/mL. A negative result does not preclude SARS-Cov-2 infection and should not be used as the sole basis for treatment or other patient management decisions. A negative result may occur with  improper specimen collection/handling, submission of specimen other than nasopharyngeal swab, presence of viral mutation(s) within the areas targeted by this assay, and inadequate number of viral copies(<138 copies/mL). A negative result must be combined with clinical observations, patient history, and epidemiological information. The expected result is Negative.  Fact Sheet for Patients:  EntrepreneurPulse.com.au  Fact Sheet for Healthcare Providers:  IncredibleEmployment.be  This test is no t yet approved or cleared by the [place] FDA and  has been authorized for detection and/or diagnosis of SARS-CoV-2 by FDA under an Emergency Use Authorization (EUA). This EUA will remain  in effect (meaning this test can be used) for the duration of the COVID-19 declaration under Section 564(b)(1) of the Act, 21 U.S.C.section 360bbb-3(b)(1), unless the authorization is terminated  or revoked sooner.       Influenza A by PCR NEGATIVE NEGATIVE Final   Influenza B by PCR NEGATIVE NEGATIVE  Final    Comment: (NOTE) The Xpert Xpress SARS-CoV-2/FLU/RSV plus assay is intended as an aid in the diagnosis of influenza from Nasopharyngeal swab specimens and should not be used as a sole basis for treatment. Nasal washings and aspirates are unacceptable for Xpert Xpress SARS-CoV-2/FLU/RSV testing.  Fact Sheet for Patients: EntrepreneurPulse.com.au  Fact Sheet for Healthcare Providers: IncredibleEmployment.be  This test is not yet approved or cleared by the [place] FDA and has been authorized for detection and/or diagnosis of SARS-CoV-2 by FDA under an Emergency Use Authorization (EUA). This EUA will remain in effect (meaning this test can  be used) for the duration of the COVID-19 declaration under Section 564(b)(1) of the Act, 21 U.S.C. section 360bbb-3(b)(1), unless the authorization is terminated or revoked.  Performed at [facility], [address] [place], [place] [address]   Culture, blood (Routine X 2) w Reflex to ID Panel     Status: None (Preliminary result)   Collection Time: 08/17/21  4:18 AM   Specimen: BLOOD  Result Value Ref Range Status   Specimen Description BLOOD RIGHT FA  Final   Special Requests   Final    BOTTLES DRAWN AEROBIC AND ANAEROBIC Blood Culture results may not be optimal due to an inadequate volume of blood received in culture bottles   Culture   Final    NO GROWTH <12 HOURS Performed at [facility], [address] [place], [place] [address]    Report Status PENDING  Incomplete     Radiological Exams on Admission: DG Chest Port 1 View  Result Date: 08/16/2021 CLINICAL DATA:  Sepsis. EXAM: PORTABLE CHEST 1 VIEW COMPARISON:  Lung bases from abdominal CT earlier today. FINDINGS: Mild cardiomegaly. Aortic atherosclerosis and tortuosity. Minor left greater than right basilar airspace disease. No confluent consolidation. No pleural effusion, pulmonary edema, or pneumothorax. No acute  osseous findings. IMPRESSION: 1. Minor left greater than right basilar airspace disease, likely atelectasis. 2. Mild cardiomegaly. Electronically Signed   By: Keith Rake M.D.   On: 08/16/2021 23:49   CT Renal Stone Study  Result Date: 08/16/2021 CLINICAL DATA:  UTI possible sepsis EXAM: CT ABDOMEN AND PELVIS WITHOUT CONTRAST TECHNIQUE: Multidetector CT imaging of the abdomen and pelvis was performed following the standard protocol without IV contrast. RADIATION DOSE REDUCTION: This exam was performed according to the departmental dose-optimization program which includes automated exposure control, adjustment of the mA and/or kV according to patient size and/or use of iterative reconstruction technique. COMPARISON:  CT 08/09/2021 FINDINGS: Lower chest: Lung bases demonstrate small bilateral effusions. No consolidative airspace disease. Mild cardiomegaly with coronary vascular calcification. Hepatobiliary: Gallstone. No biliary dilatation. No focal hepatic abnormality Pancreas: Unremarkable. No pancreatic ductal dilatation or surrounding inflammatory changes. Spleen: Normal in size without focal abnormality. Adrenals/Urinary Tract: Adrenal glands are normal. No hydronephrosis. Multiple bilateral intrarenal stones. Dominant stone upper pole left kidney measures 17 mm. No ureteral stone. Decompressed urinary bladder with Foley catheter present. Diffuse thick-walled appearance of the bladder with perivesical soft tissue stranding. Stomach/Bowel: The stomach is nonenlarged. No dilated small bowel. No acute bowel wall thickening. Diverticular disease of the left colon without acute wall thickening. Negative appendix. Vascular/Lymphatic: Advanced aortic atherosclerosis. No aneurysm. No suspicious lymph nodes Reproductive: Markedly enlarged prostate. Other: Negative for pelvic effusion or free air. Small fat containing inguinal hernia Musculoskeletal: Degenerative changes. No acute osseous abnormality. IMPRESSION:  1. Multiple bilateral kidney stones without hydronephrosis or ureteral stone. Decompressed urinary bladder with Foley catheter present. Bladder is diffusely thick-walled with perivesical soft tissue stranding which may be due to cystitis or chronic obstruction 2. Markedly enlarged prostate gland 3. Small bilateral effusions 4. Diverticular disease of the distal colon without acute wall thickening Electronically Signed   By: Donavan Foil M.D.   On: 08/16/2021 23:10      Assessment/Plan Principal Problem:   Acute pyelonephritis Active Problems:   Sepsis ([place])   HTN (hypertension)   Hypothyroidism   BPH (benign prostatic hyperplasia)   HLD (hyperlipidemia)   Sepsis due to acute pyelonephritis: Patient has dysuria, bilateral flank pain, positive urinalysis, indicating acute pyelonephritis.  CT scan for  renal stone protocol showed multiple kidney stone, but no hydronephrosis.  Patient meets the criteria for sepsis with WBC 23.2, tachycardia with heart rate of 102, tachypnea with RR 24.  Lactic acid is normal.  Currently hemodynamically stable.  Patient failed outpatient Treatment.  Will start Zosyn for broader coverage, particularly for Pseudomonas.   - Admit to tele med bed as an inpatient -  Zosyn by IV - Follow up results of urine and blood cx and amend antibiotic regimen if needed per sensitivity results - prn Zofran for nausea - will get Procalcitonin - IVF: 1L of NS bolus   HTN (hypertension): Blood pressure 113/62 -Hold blood pressure medications since blood pressure is normal, and that the patient is at high risk of developing hypotension due to sepsis -IV hydralazine as needed  Hypothyroidism -Synthroid   BPH (benign prostatic hyperplasia) -Flomax, finasteride  HLD: -lipitor    DVT ppx: SQ Lovenox  Code Status: Full code  Family Communication:  Yes, patient's  daughter  at bed side.     Disposition Plan:  Anticipate discharge back to previous environment  Consults  called:  none  Admission status and Level of care: Telemetry Medical:    as inpt       Severity of Illness:  The appropriate patient status for this patient is INPATIENT. Inpatient status is judged to be reasonable and necessary in order to provide the required intensity of service to ensure the patient's safety. The patient's presenting symptoms, physical exam findings, and initial radiographic and laboratory data in the context of their chronic comorbidities is felt to place them at high risk for further clinical deterioration. Furthermore, it is not anticipated that the patient will be medically stable for discharge from the hospital within 2 midnights of admission.   * I certify that at the point of admission it is my clinical judgment that the patient will require inpatient hospital care spanning beyond 2 midnights from the point of admission due to high intensity of service, high risk for further deterioration and high frequency of surveillance required.*       Date of Service 08/17/2021    Ivor Costa [facility]   If 7PM-7AM, please contact night-coverage [email or web address] 08/17/2021, 5:31 PM

## 2021-08-17 NOTE — ED Provider Notes (Signed)
Vitals:   08/16/21 2143 08/16/21 2225  BP: 130/63 138/72  Pulse: 100 95  Resp: 16 17  Temp: (!) 103 F (39.4 C)   SpO2: 90% 96%    Sepsis reassessment, improvement with normalization of majority vital signs at this time   Delman Kitten, MD 08/17/21 [phone]

## 2021-08-17 NOTE — Progress Notes (Signed)
Patient arrived to floor from ED, spanish interpreter used for admission and oriented to room.  Family at bedside.

## 2021-08-17 NOTE — ED Notes (Addendum)
Emiliu (family) : [phone] Call with any questions

## 2021-08-18 DIAGNOSIS — N10 Acute tubulo-interstitial nephritis: Secondary | ICD-10-CM

## 2021-08-18 LAB — BASIC METABOLIC PANEL
Anion gap: 9 (ref 5–15)
BUN: 18 mg/dL (ref 8–23)
CO2: 25 mmol/L (ref 22–32)
Calcium: 8.2 mg/dL — ABNORMAL LOW (ref 8.9–10.3)
Chloride: 103 mmol/L (ref 98–111)
Creatinine, Ser: 0.83 mg/dL (ref 0.61–1.24)
GFR, Estimated: >60 mL/min (ref >60)
Glucose, Bld: 93 mg/dL (ref 70–99)
Potassium: 3.8 mmol/L (ref 3.5–5.1)
Sodium: 137 mmol/L (ref 135–145)

## 2021-08-18 LAB — CBC
HCT: 37.8 % — ABNORMAL LOW (ref 39.0–52.0)
Hemoglobin: 12.5 g/dL — ABNORMAL LOW (ref 13.0–17.0)
MCH: 29.6 pg (ref 26.0–34.0)
MCHC: 33.1 g/dL (ref 30.0–36.0)
MCV: 89.4 fL (ref 80.0–100.0)
Platelets: 163 10*3/uL (ref 150–400)
RBC: 4.23 MIL/uL (ref 4.22–5.81)
RDW: 13.3 % (ref 11.5–15.5)
WBC: 14.3 10*3/uL — ABNORMAL HIGH (ref 4.0–10.5)
nRBC: 0 % (ref 0.0–0.2)

## 2021-08-18 MED ORDER — ADULT MULTIVITAMIN W/MINERALS CH
1.0000 | ORAL_TABLET | Freq: Every day | ORAL | Status: DC
  Administered 2021-08-18 – 2021-08-19 (×2): 1 via ORAL
  Filled 2021-08-18 (×2): qty 1

## 2021-08-18 MED ORDER — SODIUM CHLORIDE 0.9 % IV SOLN
2.0000 g | INTRAVENOUS | Status: DC
  Administered 2021-08-18: 2 g via INTRAVENOUS
  Filled 2021-08-18 (×2): qty 20

## 2021-08-18 MED ORDER — ENSURE ENLIVE PO LIQD
237.0000 mL | Freq: Two times a day (BID) | ORAL | Status: DC
  Administered 2021-08-18 – 2021-08-19 (×3): 237 mL via ORAL

## 2021-08-18 NOTE — Progress Notes (Signed)
Mobility Specialist - Progress Note    08/18/21 1100  Mobility  Activity Ambulated with assistance in hallway;Stood at bedside;Transferred from chair to bed;Dangled on edge of bed  Level of Assistance Modified independent, requires aide device or extra time  Assistive Device Standard walker  Distance Ambulated (ft) 400 ft  Activity Response Tolerated well  $Mobility charge 1 Mobility    During mobility: 90 HR, 94% SpO2  Pt supine upon arrival utilizing RA. Translator required for verbal cues. Pt say EOB + STS with ModI and extra time. IV disconnection noted but seemed to have disconnected prior to session, RN immediately notified and cleared to continue. Ambulated 2 laps around nursing station (467ft) with supervision --- no breaks , O2 and HR above. Pt voiced no complaints. Returned to chair post ambulation for linen change and assist w/ peri care. STS + ambulated for C-B transfer with supervision. Pt left supine with alarm set, needs in reach, wife at bedside.  [name] Mobility Specialist 08/18/21, 11:53 AM

## 2021-08-18 NOTE — Progress Notes (Signed)
Initial Nutrition Assessment  DOCUMENTATION CODES:   Not applicable  INTERVENTION:   -Ensure Enlive po BID, each supplement provides 350 kcal and 20 grams of protein.  -MVI with minerals daily  NUTRITION DIAGNOSIS:   Increased nutrient needs related to acute illness as evidenced by estimated needs.  GOAL:   Patient will meet greater than or equal to 90% of their needs  MONITOR:   PO intake, Supplement acceptance, Labs, Weight trends, Skin, I & O's  REASON FOR ASSESSMENT:   Malnutrition Screening Tool    ASSESSMENT:   Alec Estes is a 88 y.o. male with medical history significant of hypertension, hypothyroidism, BPH, who presents with dysuria, difficulty urinating and bilateral flank pain.  Pt admitted with sepsis secondary to acute pyelonephritis.   Reviewed I/O's: -950 ml x 24 hours and -900 ml since admission  UOP: 1.1 L x 24 hours  Pt unavailable at time of visit. Attempted to speak with pt via call to hospital room phone, however, unable to reach. RD unable to obtain further nutrition-related history or complete nutrition-focused physical exam at this time.    Pt currently on a regular diet. No meal completion data available to assess at this time.   Reviewed wt hx; no wt loss noted over the past week.   Pt with increased nutritional needs and would benefit from addition of oral nutrition supplements.   Medications reviewed and include vitamin D3.   Labs reviewed.   Diet Order:   Diet Order             Diet regular Room service appropriate? Yes; Fluid consistency: Thin  Diet effective now                   EDUCATION NEEDS:   No education needs have been identified at this time  Skin:  Skin Assessment: Reviewed RN Assessment  Last BM:  Unknown  Height:   Ht Readings from Last 1 Encounters:  08/16/21 5\' 5"  (1.651 m)    Weight:   Wt Readings from Last 1 Encounters:  08/18/21 67.8 kg    Ideal Body Weight:  61.8 kg  BMI:   Body mass index is 24.87 kg/m.  Estimated Nutritional Needs:   Kcal:  1700-1900  Protein:  80-95 grams  Fluid:  > 1.7 L    08/20/21, RD, LDN, CDCES Registered Dietitian II Certified Diabetes Care and Education Specialist Please refer to [facility] for RD and/or RD on-call/weekend/after hours pager

## 2021-08-18 NOTE — Progress Notes (Signed)
[facility]  - [place] [facility]   PATIENT NAME: Alec Estes    MR#:  [id number]  DATE OF BIRTH:  [birth year]  SUBJECTIVE:   via Spanish video interpreter daughter at bedside came in with fever weakness poor appetite for few days. Patient was started on oral antibiotic and placed on Foley catheter about a week ago in the emergency room when he went with acute urinary retention. No fever today. Daughter tells me he ate much better. Patient is a bit hard on hearing.   VITALS:  Blood pressure (!) 126/52, pulse 68, temperature 97.6 F (36.4 C), temperature source Oral, resp. rate 18, height 5\' 5"  (1.651 m), weight 67.8 kg, SpO2 97 %.  PHYSICAL EXAMINATION:   GENERAL:  88 y.o.-year-old patient lying in the bed with no acute distress.  LUNGS: Normal breath sounds bilaterally, no wheezing, rales, rhonchi.  CARDIOVASCULAR: S1, S2 normal. No murmurs, rubs, or gallops.  ABDOMEN: Soft, nontender, nondistended. Bowel sounds present. Foley+ EXTREMITIES: No  edema b/l.    NEUROLOGIC: nonfocal  patient is alert and awake SKIN: No obvious rash, lesion, or ulcer.   LABORATORY PANEL:  CBC Recent Labs  Lab 08/18/21 0612  WBC 14.3*  HGB 12.5*  HCT 37.8*  PLT 163    Chemistries  Recent Labs  Lab 08/17/21 0419 08/18/21 0612  NA 134* 137  K 3.7 3.8  CL 102 103  CO2 24 25  GLUCOSE 136* 93  BUN 19 18  CREATININE 1.04 0.83  CALCIUM 8.1* 8.2*  MG 1.8  --   AST 17  --   ALT 18  --   ALKPHOS 74  --   BILITOT 1.5*  --    Cardiac Enzymes No results for input(s): TROPONINI in the last 168 hours. RADIOLOGY:  DG Chest Port 1 View  Result Date: 08/16/2021 CLINICAL DATA:  Sepsis. EXAM: PORTABLE CHEST 1 VIEW COMPARISON:  Lung bases from abdominal CT earlier today. FINDINGS: Mild cardiomegaly. Aortic atherosclerosis and tortuosity. Minor left greater than right basilar airspace disease. No confluent consolidation. No pleural effusion, pulmonary edema, or  pneumothorax. No acute osseous findings. IMPRESSION: 1. Minor left greater than right basilar airspace disease, likely atelectasis. 2. Mild cardiomegaly. Electronically Signed   By: 08/18/2021 M.D.   On: 08/16/2021 23:49   CT Renal Stone Study  Result Date: 08/16/2021 CLINICAL DATA:  UTI possible sepsis EXAM: CT ABDOMEN AND PELVIS WITHOUT CONTRAST TECHNIQUE: Multidetector CT imaging of the abdomen and pelvis was performed following the standard protocol without IV contrast. RADIATION DOSE REDUCTION: This exam was performed according to the departmental dose-optimization program which includes automated exposure control, adjustment of the mA and/or kV according to patient size and/or use of iterative reconstruction technique. COMPARISON:  CT 08/09/2021 FINDINGS: Lower chest: Lung bases demonstrate small bilateral effusions. No consolidative airspace disease. Mild cardiomegaly with coronary vascular calcification. Hepatobiliary: Gallstone. No biliary dilatation. No focal hepatic abnormality Pancreas: Unremarkable. No pancreatic ductal dilatation or surrounding inflammatory changes. Spleen: Normal in size without focal abnormality. Adrenals/Urinary Tract: Adrenal glands are normal. No hydronephrosis. Multiple bilateral intrarenal stones. Dominant stone upper pole left kidney measures 17 mm. No ureteral stone. Decompressed urinary bladder with Foley catheter present. Diffuse thick-walled appearance of the bladder with perivesical soft tissue stranding. Stomach/Bowel: The stomach is nonenlarged. No dilated small bowel. No acute bowel wall thickening. Diverticular disease of the left colon without acute wall thickening. Negative appendix. Vascular/Lymphatic: Advanced aortic atherosclerosis. No aneurysm. No suspicious lymph nodes Reproductive: Markedly enlarged  prostate. Other: Negative for pelvic effusion or free air. Small fat containing inguinal hernia Musculoskeletal: Degenerative changes. No acute osseous  abnormality. IMPRESSION: 1. Multiple bilateral kidney stones without hydronephrosis or ureteral stone. Decompressed urinary bladder with Foley catheter present. Bladder is diffusely thick-walled with perivesical soft tissue stranding which may be due to cystitis or chronic obstruction 2. Markedly enlarged prostate gland 3. Small bilateral effusions 4. Diverticular disease of the distal colon without acute wall thickening Electronically Signed   By: Jasmine Pang M.D.   On: 08/16/2021 23:10    Assessment and Plan Rafe Mackowski is a 88 y.o. male with medical history significant of hypertension, hypothyroidism, BPH, who presents with dysuria, difficulty urinating and bilateral flank pain.  CT-renal stone: 1. Multiple bilateral kidney stones without hydronephrosis or ureteral stone. Decompressed urinary bladder with Foley catheter present. Bladder is diffusely thick-walled with perivesical soft tissue stranding which may be due to cystitis or chronic obstruction 2. Markedly enlarged prostate gland 3. Small bilateral effusions 4. Diverticular disease of the distal colon without acute wall thickening   CXR: 1. Minor left greater than right basilar airspace disease, likely atelectasis. 2. Mild cardiomegaly.  Sepsis due to acute cystitis Nephrolithiasis-- intra-renal/no obstruction -- no evidence of pyelonephritis on CT abdomen -- patient has Foley catheter that was placed about a week ago in the ER due to acute urinary retention -- discussed with daughter Vanna [place] urology--- recommends follow-up on scheduled appointment March 2 for voiding trial  -- IV Rocephin -- blood culture negative -- urine culture gram-negative rods -- came in with white count of 22K--- 14 K -- no fever  enlarged prostate/BPH with urine retention -- continue finasteride and Flomax  Hyperlipidemia on statins  Hypertension -- blood pressure soft. Holding amlodipine  overall improving slowly. If  remains stable will discharge in 1 to 2 days  mobility therapist to ambulate patient  DC telemetry  Procedures: Family communication : daughter at bedside Consults : CODE STATUS: full DVT Prophylaxis : Lovenox Level of care: Telemetry Medical Status is: Inpatient Remains inpatient appropriate because: sepsis due to UTI  anticipate discharge 1 to 2 days          TOTAL TIME TAKING CARE OF THIS PATIENT: 25 minutes.  >50% time spent on counselling and coordination of care  Note: This dictation was prepared with Dragon dictation along with smaller phrase technology. Any transcriptional errors that result from this process are unintentional.  Enedina Finner M.D    [facility]   CC: Primary care physician; Pcp, No

## 2021-08-19 LAB — URINE CULTURE: Culture: >=100000 — AB

## 2021-08-19 MED ORDER — CIPROFLOXACIN HCL 500 MG PO TABS: 500.0000 mg | ORAL_TABLET | Freq: Two times a day (BID) | ORAL | 0 refills | Status: AC

## 2021-08-19 MED ORDER — CIPROFLOXACIN HCL 500 MG PO TABS
500.0000 mg | ORAL_TABLET | Freq: Two times a day (BID) | ORAL | Status: DC
  Administered 2021-08-19: 500 mg via ORAL
  Filled 2021-08-19: qty 1

## 2021-08-19 NOTE — Discharge Instructions (Signed)
Foley care as per instructions

## 2021-08-19 NOTE — Plan of Care (Signed)

## 2021-08-19 NOTE — Discharge Summary (Signed)
Physician Discharge Summary   Patient: Alec Estes MRN: [id number] DOB: 03/12/1930  Admit date:     08/16/2021  Discharge date: 08/19/21  Discharge Physician: Alec Estes   PCP: Alec Estes   Recommendations at discharge:   Foley care per protocol patient to follow-up with Alec Estes urology on scheduled appointment March 2  Discharge Diagnoses: sepsis due to acute cystitis with history of nephrolithiasis  Hospital Course:   Alec Estes is a 88 y.o. male with medical history significant of hypertension, hypothyroidism, BPH, who presents with dysuria, difficulty urinating and bilateral flank pain.  CT-renal stone: 1. Multiple bilateral kidney stones without hydronephrosis or ureteral stone. Decompressed urinary bladder with Foley catheter present. Bladder is diffusely thick-walled with perivesical soft tissue stranding which may be due to cystitis or chronic obstruction 2. Markedly enlarged prostate gland 3. Small bilateral effusions 4. Diverticular disease of the distal colon without acute wall thickening   CXR: 1. Minor left greater than right basilar airspace disease, likely atelectasis. 2. Mild cardiomegaly.   Sepsis due to acute cystitis Nephrolithiasis-- intra-renal/Estes obstruction -- Estes evidence of pyelonephritis on CT abdomen -- patient has Foley catheter that was placed about a week ago in the ER due to acute urinary retention -- discussed with daughter Alec Scotlandshley Estes urology--- recommends follow-up on scheduled appointment March 2 for voiding trial  -- IV Rocephin--> change to po ciprofloxacin -- blood culture negative -- urine culture gram-negative rods--Pseudomonas aeroginosa -- came in with white count of 22K--- 14 K -- Estes fever   enlarged prostate/BPH with urine retention -- continue finasteride and Flomax   Hyperlipidemia on statins   Hypertension -- blood pressure soft. resume amlodipine with holding parameter at d/c    overall improving slowly. Discharge plan discussed with patient's daughter in the room via Spanish video interpreter. She voiced understanding.  Overall hemodynamically stable. Ambulated well with mobility therapist.      Consultants: none Procedures performed: none  Disposition: Home Diet recommendation:  Discharge Diet Orders (From admission, onward)     Start     Ordered   08/19/21 0000  Diet - low sodium heart healthy        08/19/21 0904           Cardiac diet  DISCHARGE MEDICATION: Allergies as of 08/19/2021   Estes Known Allergies      Medication List     STOP taking these medications    cephALEXin 500 MG capsule Commonly known as: KEFLEX       TAKE these medications    amLODipine 5 MG tablet Commonly known as: NORVASC Take 5 mg by mouth daily.   atorvastatin 20 MG tablet Commonly known as: LIPITOR Take 20 mg by mouth daily.   ciprofloxacin 500 MG tablet Commonly known as: CIPRO Take 1 tablet (500 mg total) by mouth 2 (two) times daily for 9 days.   finasteride 5 MG tablet Commonly known as: PROSCAR Take 5 mg by mouth daily.   Gemtesa 75 MG Tabs Generic drug: Vibegron Take 75 mg by mouth daily. Sample   lactulose 10 GM/15ML solution Commonly known as: CHRONULAC Take 30 mLs (20 g total) by mouth daily as needed for mild constipation.   levothyroxine 75 MCG tablet Commonly known as: SYNTHROID Take 75 mcg by mouth daily before breakfast.   potassium chloride 10 MEQ tablet Commonly known as: KLOR-CON M Take 10 mEq by mouth daily.   tamsulosin 0.4 MG Caps capsule Commonly known as: FLOMAX Take 0.4 mg  by mouth daily.   traMADol 50 MG tablet Commonly known as: ULTRAM Take 50 mg by mouth every 12 (twelve) hours as needed.   Vitamin D3 125 MCG (5000 UT) Tabs Take 5,000 Units by mouth daily.        Follow-up Information     Alec Estes, Ashley, MD. Go on 08/23/2021.   Specialty: Urology Why: on your scheduled appoinment Contact  information: [address] [place] [phone]                 Discharge Exam: Ceasar MonsFiled Weights   08/16/21 2144 08/18/21 0500  Weight: 62 kg 67.8 kg     Condition at discharge: fair  The results of significant diagnostics from this hospitalization (including imaging, microbiology, ancillary and laboratory) are listed below for reference.   Imaging Studies: DG Chest Port 1 View  Result Date: 08/16/2021 CLINICAL DATA:  Sepsis. EXAM: PORTABLE CHEST 1 VIEW COMPARISON:  Lung bases from abdominal CT earlier today. FINDINGS: Mild cardiomegaly. Aortic atherosclerosis and tortuosity. Minor left greater than right basilar airspace disease. Estes confluent consolidation. Estes pleural effusion, pulmonary edema, or pneumothorax. Estes acute osseous findings. IMPRESSION: 1. Minor left greater than right basilar airspace disease, likely atelectasis. 2. Mild cardiomegaly. Electronically Signed   By: Narda RutherfordMelanie  Sanford M.D.   On: 08/16/2021 23:49   DG Abd Acute W/Chest  Result Date: 08/09/2021 CLINICAL DATA:  88 year old male with difficulty voiding. EXAM: DG ABDOMEN ACUTE WITH 1 VIEW CHEST COMPARISON:  None. FINDINGS: Portable AP upright view of the chest at 0527 hours. Lung volumes and mediastinal contours within normal limits. Visualized tracheal air column is within normal limits. Allowing for portable technique the lungs are clear. Estes pneumothorax or pneumoperitoneum. Upright and supine views of the abdomen and pelvis. Estes pneumoperitoneum. Calcified aortic atherosclerosis. Evidence of bilateral nephrolithiasis, with bulky 17 mm calcification projecting over the left renal shadow. Smaller oval up to 7 mm calcifications on the right side. Phleboliths and other vascular calcifications in the pelvis. Non obstructed bowel gas pattern. Moderate levoconvex lumbar scoliosis with advanced lumbar disc and endplate degeneration. Estes acute osseous abnormality identified. IMPRESSION: 1.  Bilateral nephrolithiasis, including bulky 17 mm left side calculus. Small pelvic calcifications favored to be phleboliths rather than distal ureteral stones. 2. Normal bowel gas pattern, Estes free air. 3. Estes acute cardiopulmonary abnormality. 4.  Aortic Atherosclerosis (ICD10-I70.0). Electronically Signed   By: Odessa FlemingH  Hall M.D.   On: 08/09/2021 05:35   CT Renal Stone Study  Result Date: 08/16/2021 CLINICAL DATA:  UTI possible sepsis EXAM: CT ABDOMEN AND PELVIS WITHOUT CONTRAST TECHNIQUE: Multidetector CT imaging of the abdomen and pelvis was performed following the standard protocol without IV contrast. RADIATION DOSE REDUCTION: This exam was performed according to the departmental dose-optimization program which includes automated exposure control, adjustment of the mA and/or kV according to patient size and/or use of iterative reconstruction technique. COMPARISON:  CT 08/09/2021 FINDINGS: Lower chest: Lung bases demonstrate small bilateral effusions. Estes consolidative airspace disease. Mild cardiomegaly with coronary vascular calcification. Hepatobiliary: Gallstone. Estes biliary dilatation. Estes focal hepatic abnormality Pancreas: Unremarkable. Estes pancreatic ductal dilatation or surrounding inflammatory changes. Spleen: Normal in size without focal abnormality. Adrenals/Urinary Tract: Adrenal glands are normal. Estes hydronephrosis. Multiple bilateral intrarenal stones. Dominant stone upper pole left kidney measures 17 mm. Estes ureteral stone. Decompressed urinary bladder with Foley catheter present. Diffuse thick-walled appearance of the bladder with perivesical soft tissue stranding. Stomach/Bowel: The stomach is nonenlarged. Estes dilated small bowel. Estes acute bowel  wall thickening. Diverticular disease of the left colon without acute wall thickening. Negative appendix. Vascular/Lymphatic: Advanced aortic atherosclerosis. Estes aneurysm. Estes suspicious lymph nodes Reproductive: Markedly enlarged prostate. Other: Negative for  pelvic effusion or free air. Small fat containing inguinal hernia Musculoskeletal: Degenerative changes. Estes acute osseous abnormality. IMPRESSION: 1. Multiple bilateral kidney stones without hydronephrosis or ureteral stone. Decompressed urinary bladder with Foley catheter present. Bladder is diffusely thick-walled with perivesical soft tissue stranding which may be due to cystitis or chronic obstruction 2. Markedly enlarged prostate gland 3. Small bilateral effusions 4. Diverticular disease of the distal colon without acute wall thickening Electronically Signed   By: Jasmine Pang M.D.   On: 08/16/2021 23:10   CT Renal Stone Study  Result Date: 08/09/2021 CLINICAL DATA:  Evaluate kidney stones.  Difficulty urinating. EXAM: CT ABDOMEN AND PELVIS WITHOUT CONTRAST TECHNIQUE: Multidetector CT imaging of the abdomen and pelvis was performed following the standard protocol without IV contrast. RADIATION DOSE REDUCTION: This exam was performed according to the departmental dose-optimization program which includes automated exposure control, adjustment of the mA and/or kV according to patient size and/or use of iterative reconstruction technique. COMPARISON:  None FINDINGS: Lower chest: Scar identified within the left base. Hepatobiliary: Estes focal liver abnormality. Calcified gallstone measures 6 mm. Estes signs of gallbladder wall thickening or biliary dilatation. Pancreas: Unremarkable. Estes pancreatic ductal dilatation or surrounding inflammatory changes. Spleen: Normal in size without focal abnormality. Adrenals/Urinary Tract: Normal adrenal glands. Bilateral nephrolithiasis. Within the upper pole of the left kidney there is a large stone measuring up to 1.5 cm. There is also a small inferior pole stone measuring 1-2 mm. Multiple right renal calculi are noted. The largest stone is in the interpolar collecting system measuring 4 mm. Estes hydronephrosis identified bilaterally. Estes hydroureter or ureteral lithiasis. The  bladder is partially decompressed around a Foley catheter. There is mild soft tissue stranding surrounding the decompressed bladder. Stomach/Bowel: Stomach appears normal. The appendix is visualized and is unremarkable. Moderate retained stool noted throughout the colon. Vascular/Lymphatic: Estes bowel wall thickening, inflammation, or distension. Reproductive: Prostate gland measures 5.1 x 5.9 by 6.6 cm (volume = 100 cm^3), image 79/2. Calcifications are scattered throughout the prostate gland. Other: Estes free fluid or fluid collections identified. Fat containing umbilical hernia. Signs of previous left inguinal hernia repair identified. Musculoskeletal: Multilevel degenerative disc disease is identified throughout the lumbar spine. IMPRESSION: 1. Bilateral nephrolithiasis. There is Estes hydronephrosis, hydroureter or ureteral lithiasis at this time. 2. Urinary bladder is decompressed around a Foley catheter balloon. Soft tissue stranding is identified surrounding the decompressed bladder which may reflect sequelae of cystitis or chronic bladder outlet obstruction. 3. Prostatomegaly. 4. Gallstone. 5. Moderate retained stool noted throughout the colon. Correlate for any clinical symptoms of constipation. Electronically Signed   By: Signa Kell M.D.   On: 08/09/2021 06:37    Microbiology: Results for orders placed or performed during the hospital encounter of 08/16/21  Culture, blood (Routine x 2)     Status: None (Preliminary result)   Collection Time: 08/16/21 10:02 PM   Specimen: BLOOD  Result Value Ref Range Status   Specimen Description BLOOD RIGHT ASSIST CONTROL  Final   Special Requests   Final    BOTTLES DRAWN AEROBIC AND ANAEROBIC Blood Culture adequate volume   Culture   Final    Estes GROWTH 3 DAYS Performed at [facility], [address] [place], [place] [address]    Report Status PENDING  Incomplete  Urine Culture  Status: Abnormal   Collection Time: 08/16/21 10:02 PM    Specimen: Urine, Random  Result Value Ref Range Status   Specimen Description   Final    URINE, RANDOM Performed at [facility], [address] [place], [place] [address]    Special Requests   Final    NONE Performed at [facility], [address] [place], [place] [address]    Culture >=100,000 COLONIES/mL PSEUDOMONAS AERUGINOSA (A)  Final   Report Status 08/19/2021 FINAL  Final   Organism ID, Bacteria PSEUDOMONAS AERUGINOSA (A)  Final      Susceptibility   Pseudomonas aeruginosa - MIC*    CEFTAZIDIME 2 SENSITIVE Sensitive     CIPROFLOXACIN <=0.25 SENSITIVE Sensitive     GENTAMICIN <=1 SENSITIVE Sensitive     IMIPENEM 1 SENSITIVE Sensitive     PIP/TAZO <=4 SENSITIVE Sensitive     CEFEPIME 2 SENSITIVE Sensitive     * >=100,000 COLONIES/mL PSEUDOMONAS AERUGINOSA  Resp Panel by RT-PCR (Flu A&B, Covid) Nasopharyngeal Swab     Status: None   Collection Time: 08/17/21  1:22 AM   Specimen: Nasopharyngeal Swab; Nasopharyngeal(NP) swabs in vial transport medium  Result Value Ref Range Status   SARS Coronavirus 2 by RT PCR NEGATIVE NEGATIVE Final    Comment: (NOTE) SARS-CoV-2 target nucleic acids are NOT DETECTED.  The SARS-CoV-2 RNA is generally detectable in upper respiratory specimens during the acute phase of infection. The lowest concentration of SARS-CoV-2 viral copies this assay can detect is 138 copies/mL. A negative result does not preclude SARS-Cov-2 infection and should not be used as the sole basis for treatment or other patient management decisions. A negative result may occur with  improper specimen collection/handling, submission of specimen other than nasopharyngeal swab, presence of viral mutation(s) within the areas targeted by this assay, and inadequate number of viral copies(<138 copies/mL). A negative result must be combined with clinical observations, patient history, and epidemiological information. The expected result is Negative.  Fact  Sheet for Patients:  BloggerCourse.com  Fact Sheet for Healthcare Providers:  SeriousBroker.it  This test is Estes t yet approved or cleared by the [place] FDA and  has been authorized for detection and/or diagnosis of SARS-CoV-2 by FDA under an Emergency Use Authorization (EUA). This EUA will remain  in effect (meaning this test can be used) for the duration of the COVID-19 declaration under Section 564(b)(1) of the Act, 21 U.S.C.section 360bbb-3(b)(1), unless the authorization is terminated  or revoked sooner.       Influenza A by PCR NEGATIVE NEGATIVE Final   Influenza B by PCR NEGATIVE NEGATIVE Final    Comment: (NOTE) The Xpert Xpress SARS-CoV-2/FLU/RSV plus assay is intended as an aid in the diagnosis of influenza from Nasopharyngeal swab specimens and should not be used as a sole basis for treatment. Nasal washings and aspirates are unacceptable for Xpert Xpress SARS-CoV-2/FLU/RSV testing.  Fact Sheet for Patients: BloggerCourse.com  Fact Sheet for Healthcare Providers: SeriousBroker.it  This test is not yet approved or cleared by the [place] FDA and has been authorized for detection and/or diagnosis of SARS-CoV-2 by FDA under an Emergency Use Authorization (EUA). This EUA will remain in effect (meaning this test can be used) for the duration of the COVID-19 declaration under Section 564(b)(1) of the Act, 21 U.S.C. section 360bbb-3(b)(1), unless the authorization is terminated or revoked.  Performed at [facility], [address] [place], [place] [address]   Culture, blood (Routine X 2) w Reflex to  ID Panel     Status: None (Preliminary result)   Collection Time: 08/17/21  4:18 AM   Specimen: BLOOD  Result Value Ref Range Status   Specimen Description BLOOD RIGHT FA  Final   Special Requests   Final    BOTTLES DRAWN AEROBIC AND ANAEROBIC Blood  Culture results may not be optimal due to an inadequate volume of blood received in culture bottles   Culture   Final    Estes GROWTH 2 DAYS Performed at [facility], [address] [place], [place] [address]    Report Status PENDING  Incomplete    Labs: CBC: Recent Labs  Lab 08/16/21 2158 08/17/21 0419 08/18/21 0612  WBC 22.6* 23.5* 14.3*  NEUTROABS 19.1* 19.4*  --   HGB 13.9 13.1 12.5*  HCT 41.8 38.8* 37.8*  MCV 89.1 89.0 89.4  PLT 191 168 163   Basic Metabolic Panel: Recent Labs  Lab 08/16/21 2158 08/17/21 0419 08/18/21 0612  NA 133* 134* 137  K 3.8 3.7 3.8  CL 101 102 103  CO2 24 24 25   GLUCOSE 132* 136* 93  BUN 20 19 18   CREATININE 0.98 1.04 0.83  CALCIUM 8.4* 8.1* 8.2*  MG  --  1.8  --    Liver Function Tests: Recent Labs  Lab 08/16/21 2158 08/17/21 0419  AST 21 17  ALT 20 18  ALKPHOS 90 74  BILITOT 1.6* 1.5*  PROT 7.4 6.4*  ALBUMIN 3.6 3.1*   CBG: Estes results for input(s): GLUCAP in the last 168 hours.  Discharge time spent: greater than 30 minutes.  Signed: 2159, MD [facility] 08/19/2021

## 2021-08-22 LAB — CULTURE, BLOOD (ROUTINE X 2): Culture: NO GROWTH

## 2021-08-23 ENCOUNTER — Other Ambulatory Visit: Payer: Self-pay

## 2021-08-23 ENCOUNTER — Ambulatory Visit: Payer: Self-pay | Admitting: Urology

## 2021-08-23 ENCOUNTER — Ambulatory Visit (INDEPENDENT_AMBULATORY_CARE_PROVIDER_SITE_OTHER): Payer: Medicaid - Out of State | Admitting: Urology

## 2021-08-23 ENCOUNTER — Encounter: Payer: Self-pay | Admitting: Urology

## 2021-08-23 VITALS — BP 122/73 | HR 98 | Ht 65.0 in | Wt 152.1 lb

## 2021-08-23 DIAGNOSIS — N200 Calculus of kidney: Secondary | ICD-10-CM

## 2021-08-23 DIAGNOSIS — R339 Retention of urine, unspecified: Secondary | ICD-10-CM

## 2021-08-23 DIAGNOSIS — N401 Enlarged prostate with lower urinary tract symptoms: Secondary | ICD-10-CM

## 2021-08-23 LAB — CULTURE, BLOOD (ROUTINE X 2)
Culture: NO GROWTH
Special Requests: ADEQUATE

## 2021-08-23 LAB — BLADDER SCAN AMB NON-IMAGING: Scan Result: 141

## 2021-08-23 NOTE — Progress Notes (Signed)
? ?08/23/2021 ?10:16 AM  ? ?Alec Estes ?1929-09-19 ?[id number] ? ?Referring provider: No referring provider defined for this encounter. ? ?Chief Complaint  ?Patient presents with  ? Urinary Retention  ? ? ?HPI: ?Alec Estes is a 88 y.o. male who presents for follow-up of recent hospitalization.  He presents today with his daughter and a [employer] interpreter. ? ?Hospitalized 08/16/2021 [facility] with UTI and urinary retention ?CT showed bilateral, nonobstructing renal calculi.  Thick-walled bladder without hydronephrosis. ?Urine culture grew Pseudomonas; discharged 08/19/2021 on Cipro ?No complaints today ?No prior history of urinary retention ?Started on tamsulosin and finasteride in the hospital which he has continued ? ? ?PMH: ?Past Medical History:  ?Diagnosis Date  ? BPH (benign prostatic hyperplasia)   ? HLD (hyperlipidemia)   ? Hypertension   ? Hypothyroidism   ? ? ?Surgical History: ?Past Surgical History:  ?Procedure Laterality Date  ? CHOLECYSTECTOMY    ? HERNIA REPAIR    ? ? ?Home Medications:  ?Allergies as of 08/23/2021   ?No Known Allergies ?  ? ?  ?Medication List  ?  ? ?  ? Accurate as of August 23, 2021 10:16 AM. If you have any questions, ask your nurse or doctor.  ?  ?  ? ?  ? ?amLODipine 5 MG tablet ?Commonly known as: NORVASC ?Take 5 mg by mouth daily. ?  ?atorvastatin 20 MG tablet ?Commonly known as: LIPITOR ?Take 20 mg by mouth daily. ?  ?ciprofloxacin 500 MG tablet ?Commonly known as: CIPRO ?Take 1 tablet (500 mg total) by mouth 2 (two) times daily for 9 days. ?  ?finasteride 5 MG tablet ?Commonly known as: PROSCAR ?Take 5 mg by mouth daily. ?  ?Gemtesa 75 MG Tabs ?Generic drug: Vibegron ?Take 75 mg by mouth daily. Sample ?  ?lactulose 10 GM/15ML solution ?Commonly known as: CHRONULAC ?Take 30 mLs (20 g total) by mouth daily as needed for mild constipation. ?  ?levothyroxine 75 MCG tablet ?Commonly known as: SYNTHROID ?Take 75 mcg by mouth daily before breakfast. ?   ?potassium chloride 10 MEQ tablet ?Commonly known as: KLOR-CON M ?Take 10 mEq by mouth daily. ?  ?tamsulosin 0.4 MG Caps capsule ?Commonly known as: FLOMAX ?Take 0.4 mg by mouth daily. ?  ?traMADol 50 MG tablet ?Commonly known as: ULTRAM ?Take 50 mg by mouth every 12 (twelve) hours as needed. ?  ?Vitamin D3 125 MCG (5000 UT) Tabs ?Take 5,000 Units by mouth daily. ?  ? ?  ? ? ?Allergies: No Known Allergies ? ?Family History: ?Family History  ?Problem Relation Age of Onset  ? Heart attack Mother   ? ? ?Social History:  reports that he has never smoked. He has never used smokeless tobacco. He reports that he does not drink alcohol and does not use drugs. ? ? ?Physical Exam: ?BP 122/73   Pulse 98   Ht 5\' 5"  (1.651 m)   Wt 152 lb 1.9 oz (69 kg)   BMI 25.31 kg/m?   ?Constitutional:  Alert and oriented, No acute distress. ?HEENT:  AT, moist mucus membranes.  Trachea midline, no masses. ?Cardiovascular: No clubbing, cyanosis, or edema. ?Respiratory: Normal respiratory effort, no increased work of breathing. ?GI: Abdomen is soft, nontender, nondistended, no abdominal masses ?GU: Foley catheter draining clear urine ? ? ? ?Pertinent Imaging: ?CT images were personally reviewed and interpreted.  Nonobstructing renal calculi.  Prostate volume calculated at 97 cc ? ?CT Renal Stone Study ? ?Narrative ?CLINICAL DATA:  UTI possible sepsis ? ?EXAM: ?CT  ABDOMEN AND PELVIS WITHOUT CONTRAST ? ?TECHNIQUE: ?Multidetector CT imaging of the abdomen and pelvis was performed ?following the standard protocol without IV contrast. ? ?RADIATION DOSE REDUCTION: This exam was performed according to the ?departmental dose-optimization program which includes automated ?exposure control, adjustment of the mA and/or kV according to ?patient size and/or use of iterative reconstruction technique. ? ?COMPARISON:  CT 08/09/2021 ? ?FINDINGS: ?Lower chest: Lung bases demonstrate small bilateral effusions. No ?consolidative airspace disease. Mild  cardiomegaly with coronary ?vascular calcification. ? ?Hepatobiliary: Gallstone. No biliary dilatation. No focal hepatic ?abnormality ? ?Pancreas: Unremarkable. No pancreatic ductal dilatation or ?surrounding inflammatory changes. ? ?Spleen: Normal in size without focal abnormality. ? ?Adrenals/Urinary Tract: Adrenal glands are normal. No ?hydronephrosis. Multiple bilateral intrarenal stones. Dominant stone ?upper pole left kidney measures 17 mm. No ureteral stone. ?Decompressed urinary bladder with Foley catheter present. Diffuse ?thick-walled appearance of the bladder with perivesical soft tissue ?stranding. ? ?Stomach/Bowel: The stomach is nonenlarged. No dilated small bowel. ?No acute bowel wall thickening. 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 Negative appendix. ? ?Vascular/Lymphatic: Advanced aortic atherosclerosis. No aneurysm. No ?suspicious lymph nodes ? ?Reproductive: Markedly enlarged prostate. ? ?Other: Negative for pelvic effusion or free air. Small fat ?containing inguinal hernia ? ?Musculoskeletal: Degenerative changes. No acute osseous abnormality. ? ?IMPRESSION: ?1. Multiple bilateral kidney stones without hydronephrosis or ?ureteral stone. Decompressed urinary bladder with Foley catheter ?present. Bladder is diffusely thick-walled with perivesical soft ?tissue stranding which may be due to cystitis or chronic obstruction ?2. Markedly enlarged prostate gland ?3. Small bilateral effusions ?4. Diverticular disease of the distal colon without acute wall ?thickening ? ? ?Electronically Signed ?By: Jasmine Pang M.D. ?On: 08/16/2021 23:10 ? ? ?Assessment & Plan:   ? ?1.  BPH with urinary retention ?Foley catheter removed for voiding trial ?Continue tamsulosin/finasteride ?He return later in the afternoon stating he had been voiding and bladder scan PVR was 141 mL ?He lives in [place] and his daughter and is due to go back soon.  Recommend PCP or urology follow-up when he  gets home ? ?2.  Nephrolithiasis ?Bilateral, nonobstructing renal calculi ?Recommend follow-up when he returns to [place] ? ?Return as needed if he has any problems before he leaves the area ? ? ?Riki Altes, MD ? ?[facility] ?[address], [address] ?[place], [place] [address] ?[phone] ? ?

## 2021-08-23 NOTE — Progress Notes (Signed)
Catheter Removal  Patient is present today for a catheter removal.  9ml of water was drained from the balloon. A 16FR foley cath was removed from the bladder no complications were noted . Patient tolerated well.  Performed by: Fatime Biswell, CMA  Follow up/ Additional notes: PM PVR  

## 2021-08-24 ENCOUNTER — Encounter: Payer: Self-pay | Admitting: Urology

## 2023-01-23 IMAGING — CT CT RENAL STONE PROTOCOL
2 of 4 series · 16 of 46 positions shown, 18 images · non-contrast
Comparison: CT 08/09/2021

CLINICAL DATA: UTI possible sepsis



[Series 2: stone full standard · axial · 0.70mm/px · z∈[-886,-441]mm · 13 of 99 slices shown, 15 images]
[im 5/99  soft-tissue]
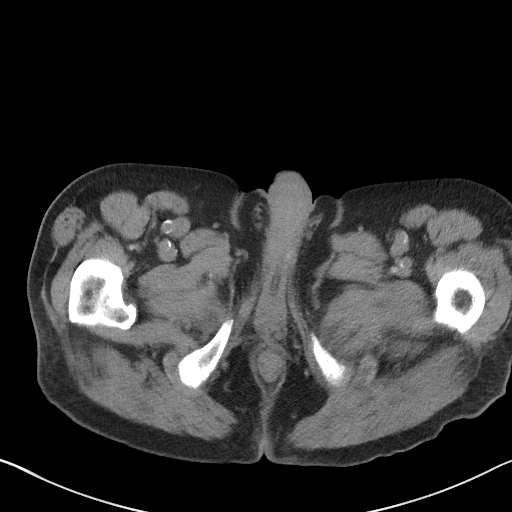
[im 5/99  bone]
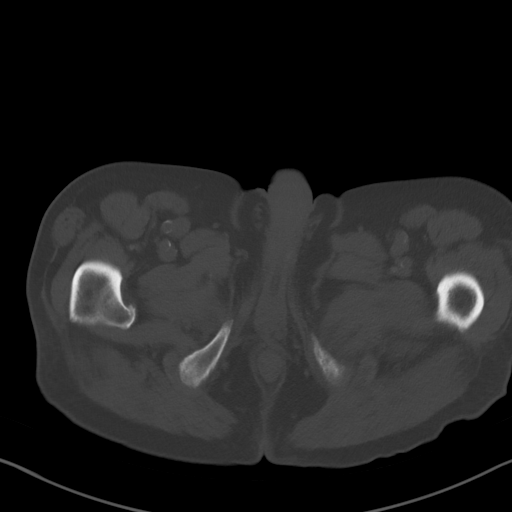
[im 13/99  soft-tissue]
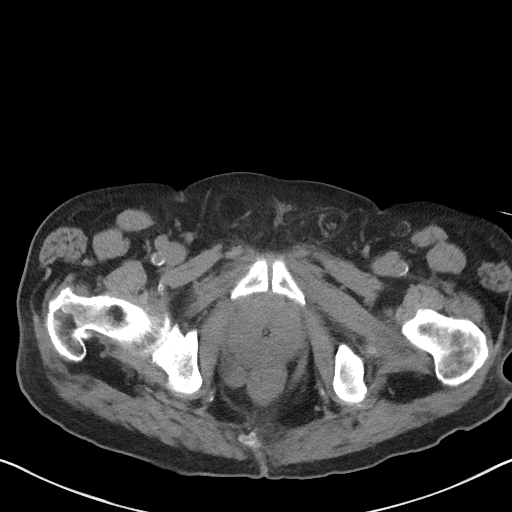
[im 21/99  soft-tissue]
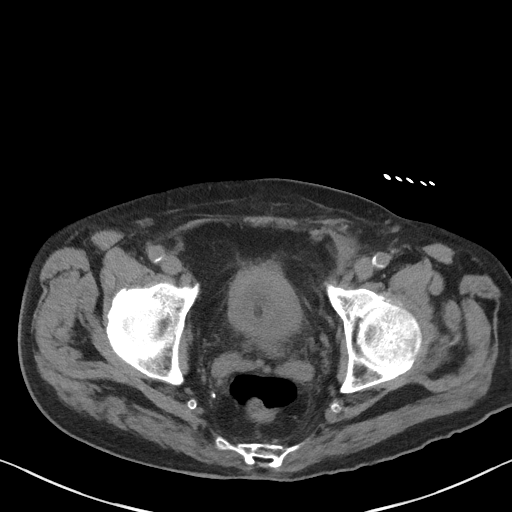
[im 29/99  soft-tissue]
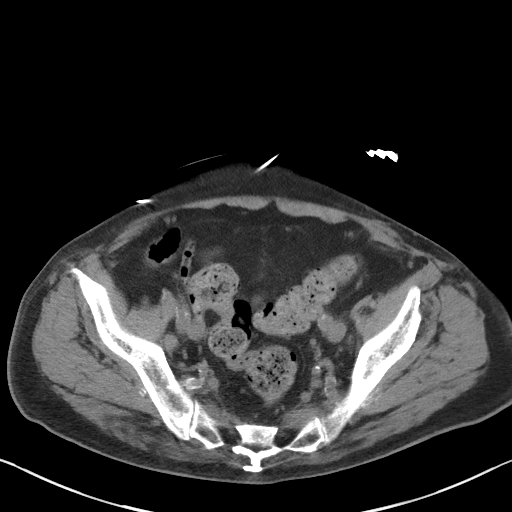
[im 33/99  soft-tissue]
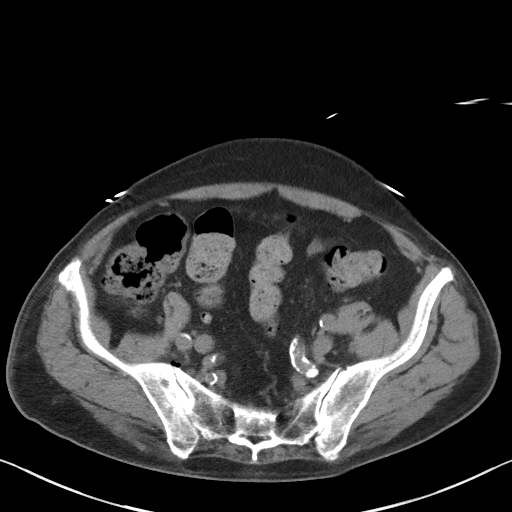
[im 41/99  soft-tissue]
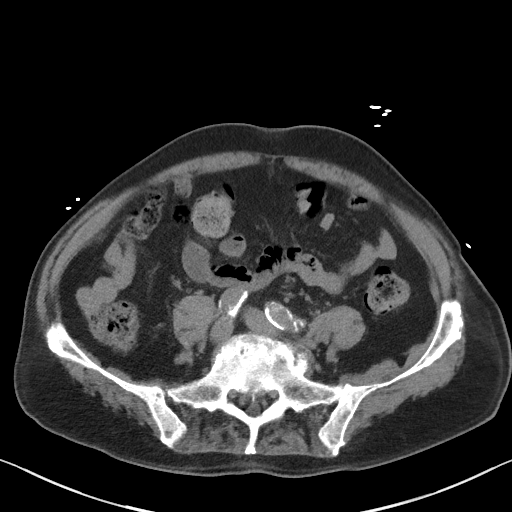
[im 50/99  soft-tissue]
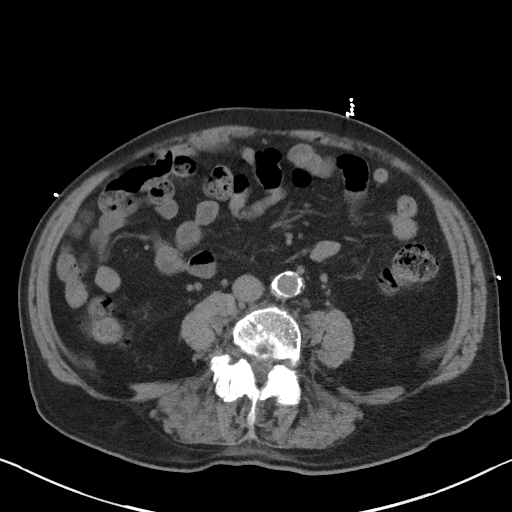
[im 58/99  soft-tissue]
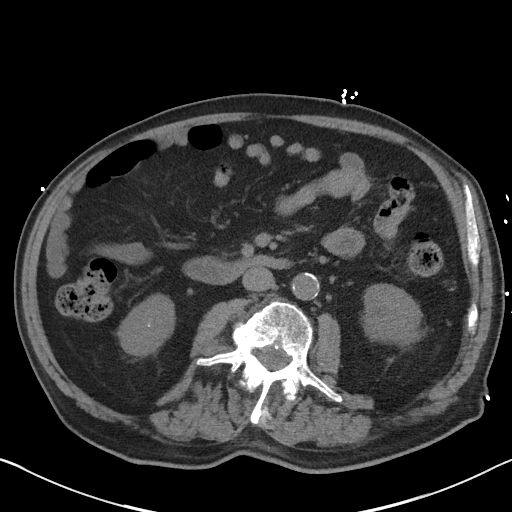
[im 66/99  soft-tissue]
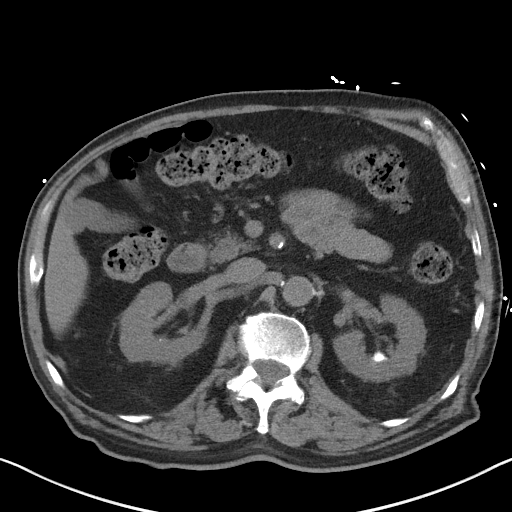
[im 66/99  bone]
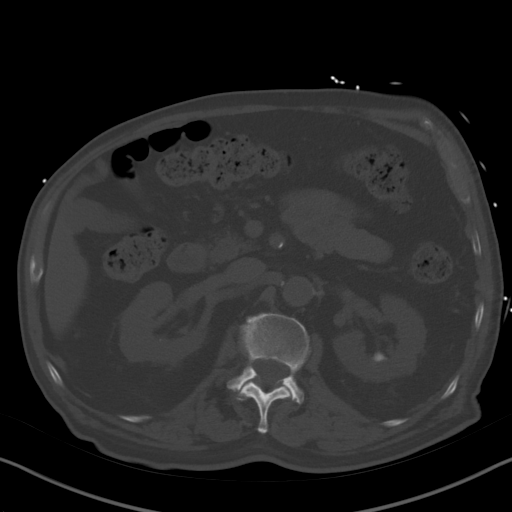
[im 70/99  soft-tissue]
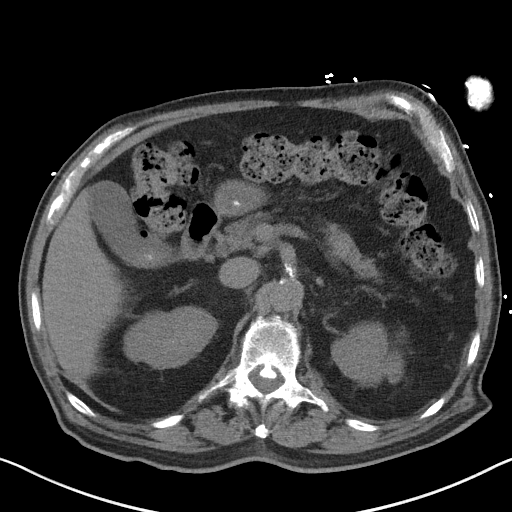
[im 78/99  soft-tissue]
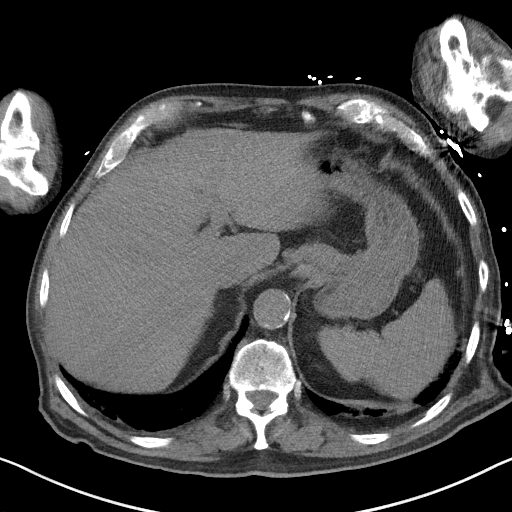
[im 86/99  soft-tissue]
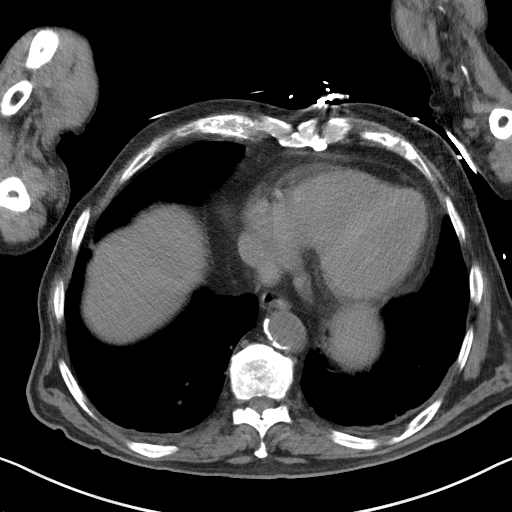
[im 94/99  soft-tissue]
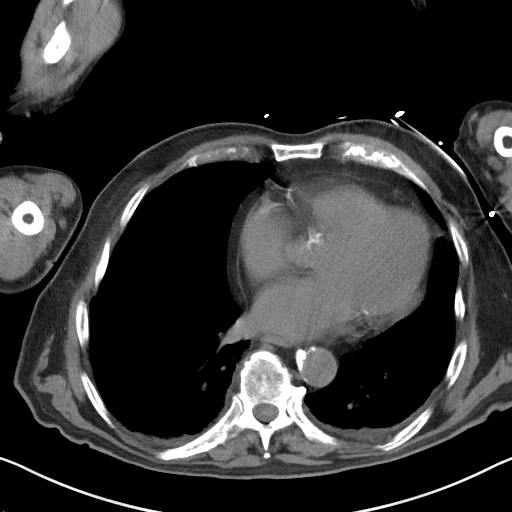

[Series 5: coronal · coronal · 0.86mm/px · 3 of 193 slices shown]
[im 65/193  soft-tissue]
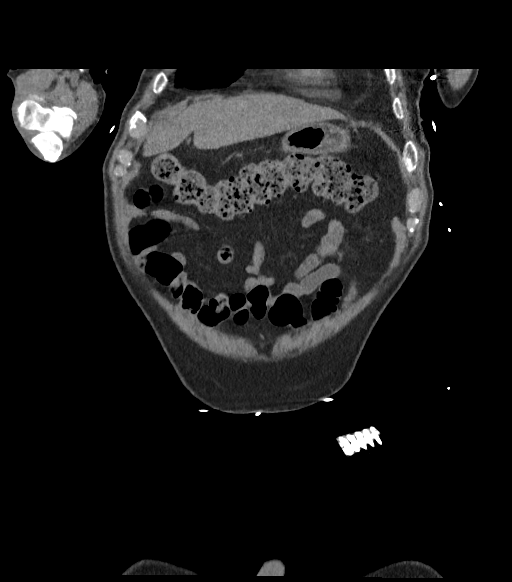
[im 86/193  soft-tissue]
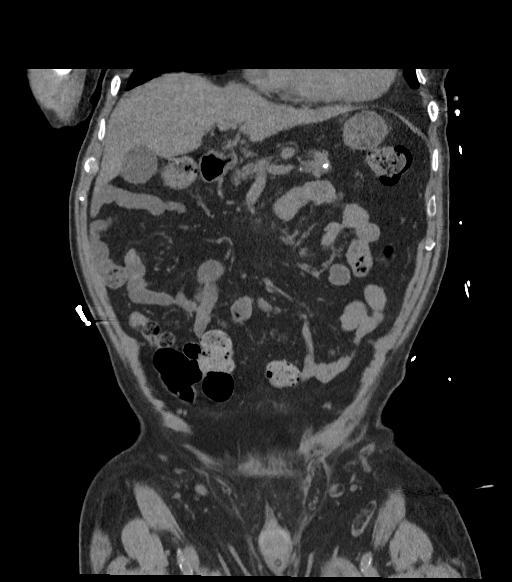
[im 107/193  soft-tissue]
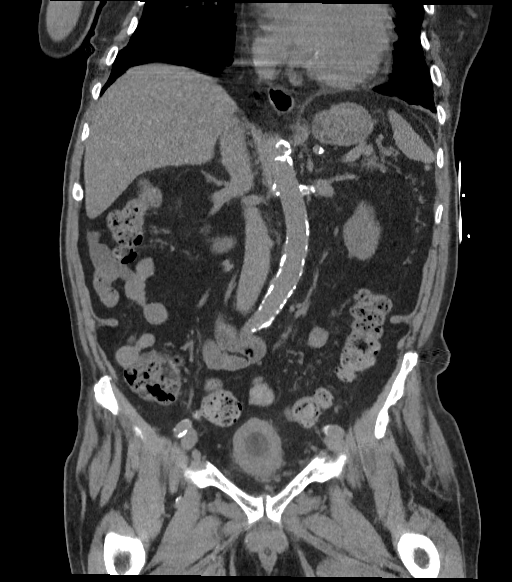

[16 of 46 positions shown; findings below may reference images not displayed]

FINDINGS: Lower chest: Lung bases demonstrate small bilateral effusions. No
consolidative airspace disease. Mild cardiomegaly with coronary
vascular calcification.

Hepatobiliary: Gallstone. No biliary dilatation. No focal hepatic
abnormality

Pancreas: Unremarkable. No pancreatic ductal dilatation or
surrounding inflammatory changes.

Spleen: Normal in size without focal abnormality.

Adrenals/Urinary Tract: Adrenal glands are normal. No
hydronephrosis. Multiple bilateral intrarenal stones. Dominant stone
upper pole left kidney measures 17 mm. No ureteral stone.
Decompressed urinary bladder with Foley catheter present. Diffuse
thick-walled appearance of the bladder with perivesical soft tissue
stranding.

Stomach/Bowel: The stomach is nonenlarged. No dilated small bowel.
No acute bowel wall thickening. Diverticular disease of the left
colon without acute wall thickening. Negative appendix.

Vascular/Lymphatic: Advanced aortic atherosclerosis. No aneurysm. No
suspicious lymph nodes

Reproductive: Markedly enlarged prostate.

Other: Negative for pelvic effusion or free air. Small fat
containing inguinal hernia

Musculoskeletal: Degenerative changes. No acute osseous abnormality.
IMPRESSION: 1. Multiple bilateral kidney stones without hydronephrosis or
ureteral stone. Decompressed urinary bladder with Foley catheter
present. Bladder is diffusely thick-walled with perivesical soft
tissue stranding which may be due to cystitis or chronic obstruction
2. Markedly enlarged prostate gland
3. Small bilateral effusions
4. Diverticular disease of the distal colon without acute wall
thickening
